# Patient Record
Sex: Female | Born: 1985 | Race: Black or African American | Hispanic: No | Marital: Married | Smoking: Never smoker
Health system: Southern US, Community
[De-identification: ages and names within clinical notes are randomized; demographics above are authoritative.]

## PROBLEM LIST (undated history)

## (undated) ENCOUNTER — Inpatient Hospital Stay (HOSPITAL_COMMUNITY): Payer: Self-pay

## (undated) DIAGNOSIS — F329 Major depressive disorder, single episode, unspecified: Secondary | ICD-10-CM

## (undated) DIAGNOSIS — G5602 Carpal tunnel syndrome, left upper limb: Secondary | ICD-10-CM

## (undated) DIAGNOSIS — F32A Depression, unspecified: Secondary | ICD-10-CM

## (undated) DIAGNOSIS — G709 Myoneural disorder, unspecified: Secondary | ICD-10-CM

## (undated) HISTORY — PX: NO PAST SURGERIES: SHX2092

---

## 2009-09-26 ENCOUNTER — Emergency Department (HOSPITAL_BASED_OUTPATIENT_CLINIC_OR_DEPARTMENT_OTHER): Admission: EM | Admit: 2009-09-26 | Discharge: 2009-09-26 | Payer: Self-pay | Admitting: Emergency Medicine

## 2009-09-29 ENCOUNTER — Emergency Department (HOSPITAL_COMMUNITY): Admission: EM | Admit: 2009-09-29 | Discharge: 2009-09-29 | Payer: Self-pay | Admitting: Family Medicine

## 2010-01-12 ENCOUNTER — Emergency Department (HOSPITAL_BASED_OUTPATIENT_CLINIC_OR_DEPARTMENT_OTHER): Admission: EM | Admit: 2010-01-12 | Discharge: 2010-01-12 | Payer: Self-pay | Admitting: Emergency Medicine

## 2010-02-13 ENCOUNTER — Emergency Department (HOSPITAL_BASED_OUTPATIENT_CLINIC_OR_DEPARTMENT_OTHER): Admission: EM | Admit: 2010-02-13 | Discharge: 2010-02-13 | Payer: Self-pay | Admitting: Emergency Medicine

## 2010-11-14 ENCOUNTER — Emergency Department (INDEPENDENT_AMBULATORY_CARE_PROVIDER_SITE_OTHER): Payer: Self-pay

## 2010-11-14 ENCOUNTER — Emergency Department (HOSPITAL_BASED_OUTPATIENT_CLINIC_OR_DEPARTMENT_OTHER)
Admission: EM | Admit: 2010-11-14 | Discharge: 2010-11-14 | Disposition: A | Discharge status: Home / Self Care | Payer: Self-pay | Attending: Emergency Medicine | Admitting: Emergency Medicine

## 2010-11-14 DIAGNOSIS — G43909 Migraine, unspecified, not intractable, without status migrainosus: Secondary | ICD-10-CM | POA: Insufficient documentation

## 2010-11-14 DIAGNOSIS — R51 Headache: Secondary | ICD-10-CM

## 2010-12-14 LAB — URINALYSIS, ROUTINE W REFLEX MICROSCOPIC
Bilirubin Urine: NEGATIVE
Glucose, UA: NEGATIVE mg/dL
Hgb urine dipstick: NEGATIVE
Specific Gravity, Urine: 1.024 (ref 1.005–1.030)
Urobilinogen, UA: 1 mg/dL (ref 0.0–1.0)
pH: 6.5 (ref 5.0–8.0)

## 2010-12-28 LAB — RAPID STREP SCREEN (MED CTR MEBANE ONLY): Streptococcus, Group A Screen (Direct): NEGATIVE

## 2011-04-17 ENCOUNTER — Encounter: Payer: Self-pay | Admitting: *Deleted

## 2011-04-17 ENCOUNTER — Emergency Department (HOSPITAL_BASED_OUTPATIENT_CLINIC_OR_DEPARTMENT_OTHER)
Admission: EM | Admit: 2011-04-17 | Discharge: 2011-04-17 | Disposition: A | Discharge status: Home / Self Care | Payer: No Typology Code available for payment source | Attending: Emergency Medicine | Admitting: Emergency Medicine

## 2011-04-17 DIAGNOSIS — S335XXA Sprain of ligaments of lumbar spine, initial encounter: Secondary | ICD-10-CM | POA: Insufficient documentation

## 2011-04-17 DIAGNOSIS — S139XXA Sprain of joints and ligaments of unspecified parts of neck, initial encounter: Secondary | ICD-10-CM | POA: Insufficient documentation

## 2011-04-17 DIAGNOSIS — S39012A Strain of muscle, fascia and tendon of lower back, initial encounter: Secondary | ICD-10-CM

## 2011-04-17 DIAGNOSIS — S161XXA Strain of muscle, fascia and tendon at neck level, initial encounter: Secondary | ICD-10-CM

## 2011-04-17 MED ORDER — IBUPROFEN 800 MG PO TABS
800.0000 mg | ORAL_TABLET | Freq: Once | ORAL | Status: AC
  Administered 2011-04-17: 800 mg via ORAL
  Filled 2011-04-17: qty 1

## 2011-04-17 MED ORDER — IBUPROFEN 800 MG PO TABS: 800.0000 mg | ORAL_TABLET | Freq: Three times a day (TID) | ORAL | Status: AC

## 2011-04-17 MED ORDER — CYCLOBENZAPRINE HCL 10 MG PO TABS: 10.0000 mg | ORAL_TABLET | Freq: Two times a day (BID) | ORAL | Status: AC | PRN
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
## 2011-05-15 ENCOUNTER — Inpatient Hospital Stay (HOSPITAL_COMMUNITY)
Admission: AD | Admit: 2011-05-15 | Discharge: 2011-05-16 | Disposition: A | Discharge status: Home / Self Care | Payer: BC Managed Care – PPO | Source: Ambulatory Visit | Attending: Obstetrics and Gynecology | Admitting: Obstetrics and Gynecology

## 2011-05-15 DIAGNOSIS — O99891 Other specified diseases and conditions complicating pregnancy: Secondary | ICD-10-CM | POA: Insufficient documentation

## 2011-05-15 DIAGNOSIS — T148XXA Other injury of unspecified body region, initial encounter: Secondary | ICD-10-CM

## 2011-05-15 DIAGNOSIS — N909 Noninflammatory disorder of vulva and perineum, unspecified: Secondary | ICD-10-CM | POA: Insufficient documentation

## 2011-05-15 DIAGNOSIS — M545 Low back pain, unspecified: Secondary | ICD-10-CM | POA: Insufficient documentation

## 2011-05-15 DIAGNOSIS — N9089 Other specified noninflammatory disorders of vulva and perineum: Secondary | ICD-10-CM

## 2011-05-15 DIAGNOSIS — Z3490 Encounter for supervision of normal pregnancy, unspecified, unspecified trimester: Secondary | ICD-10-CM

## 2011-05-15 DIAGNOSIS — O09899 Supervision of other high risk pregnancies, unspecified trimester: Secondary | ICD-10-CM

## 2011-05-16 ENCOUNTER — Inpatient Hospital Stay (HOSPITAL_COMMUNITY): Payer: BC Managed Care – PPO

## 2011-05-16 ENCOUNTER — Encounter: Payer: Self-pay | Admitting: Advanced Practice Midwife

## 2011-05-16 ENCOUNTER — Encounter (HOSPITAL_COMMUNITY): Payer: Self-pay | Admitting: Obstetrics and Gynecology

## 2011-05-16 DIAGNOSIS — M549 Dorsalgia, unspecified: Secondary | ICD-10-CM | POA: Insufficient documentation

## 2011-05-16 DIAGNOSIS — Z3490 Encounter for supervision of normal pregnancy, unspecified, unspecified trimester: Secondary | ICD-10-CM | POA: Insufficient documentation

## 2011-05-16 LAB — URINALYSIS, ROUTINE W REFLEX MICROSCOPIC
Glucose, UA: NEGATIVE mg/dL
Ketones, ur: NEGATIVE mg/dL
Leukocytes, UA: NEGATIVE
Nitrite: NEGATIVE
Specific Gravity, Urine: 1.015 (ref 1.005–1.030)
pH: 7 (ref 5.0–8.0)

## 2011-05-16 LAB — ANTIBODY SCREEN: Antibody Screen: NEGATIVE

## 2011-05-16 LAB — POCT PREGNANCY, URINE: Preg Test, Ur: POSITIVE

## 2011-05-16 LAB — ABO/RH: ABO/RH(D): O POS

## 2011-05-16 LAB — URINE MICROSCOPIC-ADD ON

## 2011-05-16 LAB — WET PREP, GENITAL: Clue Cells Wet Prep HPF POC: NONE SEEN

## 2011-05-16 LAB — HCG, QUANTITATIVE, PREGNANCY: hCG, Beta Chain, Quant, S: 787 m[IU]/mL — ABNORMAL HIGH (ref <5)

## 2011-05-16 MED ORDER — ACETAMINOPHEN 500 MG PO TABS
1000.0000 mg | ORAL_TABLET | Freq: Once | ORAL | Status: AC
  Administered 2011-05-16: 1000 mg via ORAL
  Filled 2011-05-16: qty 2

## 2011-05-16 MED ORDER — NYSTATIN-TRIAMCINOLONE 100000-0.1 UNIT/GM-% EX CREA: TOPICAL_CREAM | Freq: Four times a day (QID) | CUTANEOUS | Status: DC

## 2011-05-16 MED ORDER — CONCEPT OB 130-92.4-1 MG PO CAPS: 1.0000 | ORAL_CAPSULE | Freq: Every day | ORAL | Status: DC

## 2011-05-16 NOTE — Progress Notes (Signed)
Pt states, " I started having yellow vaginal discharge on Tues and then vaginal itching last night.  IToday I have burning when I pee, and I feel like I have to go right back. I have pain in my low back, and when I was walking tonight I had pain in my mid to upper abdomen. I looked at my vagina and I have little cuts."

## 2011-05-16 NOTE — ED Provider Notes (Signed)
History   The pt is a 25 year-old female who presents to MAU reporting: 1. Frequency x 1 week 2. External burning w/ urination, vulvar itching, scant yellow, odorless discharge and "little cuts" on her vulva since yesterday  3. Constant sore LBP that wraps around to her abd since yesterday since performing heavy lifting. She denies hematuria, VB, flank pain, fever or chills. LMP 04/01/11. Started new sexual relationship 04/25/11. Denies other recent partners.  Chief Complaint  Patient presents with  . Urinary Frequency  . Vaginal Pain  . Abdominal Pain  . Back Pain   HPI  Pertinent Gynecological History: Menses: flow is moderate and regular every 28 days without intermenstrual spotting Bleeding: normal Contraception: none   Past Medical History  Diagnosis Date  . Migraine     Past Surgical History  Procedure Date  . No past surgeries     No family history on file.  History  Substance Use Topics  . Smoking status: Never Smoker   . Smokeless tobacco: Not on file  . Alcohol Use: No    Allergies:  Allergies  Allergen Reactions  . Cheese Other (See Comments)    Face swells  . Shrimp (Shellfish Allergy) Other (See Comments)    Face swells    Prescriptions prior to admission  Medication Sig Dispense Refill  . acetaminophen (TYLENOL) 325 MG tablet Take 650 mg by mouth every 6 (six) hours as needed. pain       . ibuprofen (ADVIL,MOTRIN) 200 MG tablet Take 200 mg by mouth every 6 (six) hours as needed. pain       . diphenhydramine-acetaminophen (TYLENOL PM) 25-500 MG TABS Take 1 tablet by mouth at bedtime as needed. sleep         ROS Physical Exam   Blood pressure 100/61, pulse 83, temperature 98.7 F (37.1 C), temperature source Oral, resp. rate 20, height 5' 5.75" (1.67 m), weight 54.091 kg (119 lb 4 oz), last menstrual period 04/01/2011.  Physical Exam  Constitutional: She is oriented to person, place, and time. She appears well-developed and well-nourished. No  distress.  HENT:  Head: Normocephalic.  Cardiovascular: Normal rate.   Respiratory: Effort normal.  GI: Soft. She exhibits no distension. There is no tenderness. There is no CVA tenderness.  Genitourinary: Vagina normal and uterus normal.    Uterus is not enlarged and not tender. Cervix exhibits discharge. Cervix exhibits no motion tenderness and no friability. Right adnexum displays no mass, no tenderness and no fullness. Left adnexum displays no mass, no tenderness and no fullness. No bleeding around the vagina. No vaginal discharge found.       Fissures and mild excoriation on perineum. No vesicles or erythema.  Musculoskeletal: Normal range of motion.  Neurological: She is alert and oriented to person, place, and time.  Skin: Skin is warm and dry.  Psychiatric: She has a normal mood and affect.   Results for orders placed during the hospital encounter of 05/15/11 (from the past 24 hour(s))  URINALYSIS, ROUTINE W REFLEX MICROSCOPIC     Status: Abnormal   Collection Time   05/16/11 12:20 AM      Component Value Range   Color, Urine YELLOW  YELLOW    Appearance CLEAR  CLEAR    Specific Gravity, Urine 1.015  1.005 - 1.030    pH 7.0  5.0 - 8.0    Glucose, UA NEGATIVE  NEGATIVE (mg/dL)   Hgb urine dipstick TRACE (*) NEGATIVE    Bilirubin Urine NEGATIVE  NEGATIVE    Ketones, ur NEGATIVE  NEGATIVE (mg/dL)   Protein, ur NEGATIVE  NEGATIVE (mg/dL)   Urobilinogen, UA 0.2  0.0 - 1.0 (mg/dL)   Nitrite NEGATIVE  NEGATIVE    Leukocytes, UA NEGATIVE  NEGATIVE   URINE MICROSCOPIC-ADD ON     Status: Normal   Collection Time   05/16/11 12:20 AM      Component Value Range   Squamous Epithelial / LPF RARE  RARE    WBC, UA 0-2  <3 (WBC/hpf)   RBC / HPF 0-2  <3 (RBC/hpf)  POCT PREGNANCY, URINE     Status: Normal   Collection Time   05/16/11  1:02 AM      Component Value Range   Preg Test, Ur POSITIVE    WET PREP, GENITAL     Status: Abnormal   Collection Time   05/16/11  1:40 AM       Component Value Range   Yeast, Wet Prep NONE SEEN  NONE SEEN    Trich, Wet Prep NONE SEEN  NONE SEEN    Clue Cells, Wet Prep NONE SEEN  NONE SEEN    WBC, Wet Prep HPF POC FEW (*) NONE SEEN    US Ob Comp Less 14 Wks  05/16/2011  *RADIOLOGY REPORT*  Clinical Data: Back pain, positive pregnancy test; no quantitative beta HCG for correlation  OBSTETRIC <14 WK [place] AND TRANSVAGINAL OB US  Technique:  Both transabdominal and transvaginal ultrasound examinations were performed for complete evaluation of the gestation as well as the maternal uterus, adnexal regions, and pelvic cul-de-sac.  Transvaginal technique was performed to assess early pregnancy.  Comparison:  None.  Intrauterine gestational sac:  Tiny focal fluid collection within endometrial canal, rounded, question tiny early intrauterine gestational sac. Yolk sac: Not identified Embryo: Not identified Cardiac Activity: N/A Heart Rate: N/A bpm  MSD: 2.3  mm     4 w    5 d [place] EDC: 01/18/2012  Maternal uterus/adnexae: No subchorionic hemorrhage. Right ovary normal size morphology, 1.8 x 1.9 x 3.9 cm. Left ovary measures 4.7 x 2.7 x 3.2 cm and contains a small corpus luteal cyst. No adnexal masses. Trace free pelvic fluid.  IMPRESSION: Probable tiny early intrauterine gestational sac, corresponding to gestational age of [redacted] weeks 5 days. No yolk sac or fetal pole are identified. Can establish fetal viability by follow-up ultrasound in 14 days.  Original Report Authenticated By: Lollie Marrow, M.D.    MAU Course  Procedures   Assessment and Plan  Assessment: 1. Early pregnancy. 6.3 weeks by [facility], 5.0 weeks by [facility]. Too early to determine if IUP by [place]. 2. LBP most likely MS, but cannot R/O ectopic 3. Perineal fissures  Plan: 1. Quant hcg 2. F/U US in 2 weeks 3. Ectopic precautions 4. Rx Triamcinolone 5. Urine culture  Orlandria Kissner 05/16/2011, 2:04 AM

## 2011-05-17 LAB — GC/CHLAMYDIA PROBE AMP, GENITAL: Chlamydia, DNA Probe: NEGATIVE

## 2011-05-17 NOTE — ED Provider Notes (Signed)
Agree with above note.  Raven Rhodes 05/17/2011 4:00 PM

## 2011-05-29 ENCOUNTER — Ambulatory Visit (HOSPITAL_COMMUNITY)
Admission: RE | Admit: 2011-05-29 | Discharge: 2011-05-29 | Disposition: A | Discharge status: Home / Self Care | Payer: BC Managed Care – PPO | Source: Ambulatory Visit | Attending: Obstetrics & Gynecology | Admitting: Obstetrics & Gynecology

## 2011-05-29 ENCOUNTER — Emergency Department (HOSPITAL_BASED_OUTPATIENT_CLINIC_OR_DEPARTMENT_OTHER)
Admission: EM | Admit: 2011-05-29 | Discharge: 2011-05-29 | Disposition: A | Discharge status: Home / Self Care | Payer: BC Managed Care – PPO | Attending: Emergency Medicine | Admitting: Emergency Medicine

## 2011-05-29 ENCOUNTER — Inpatient Hospital Stay (HOSPITAL_COMMUNITY)
Admission: AD | Admit: 2011-05-29 | Discharge: 2011-05-30 | Disposition: A | Discharge status: Home / Self Care | Payer: BC Managed Care – PPO | Source: Ambulatory Visit | Attending: Obstetrics & Gynecology | Admitting: Obstetrics & Gynecology

## 2011-05-29 ENCOUNTER — Encounter (HOSPITAL_BASED_OUTPATIENT_CLINIC_OR_DEPARTMENT_OTHER): Payer: Self-pay

## 2011-05-29 DIAGNOSIS — G43909 Migraine, unspecified, not intractable, without status migrainosus: Secondary | ICD-10-CM | POA: Insufficient documentation

## 2011-05-29 DIAGNOSIS — Z3490 Encounter for supervision of normal pregnancy, unspecified, unspecified trimester: Secondary | ICD-10-CM

## 2011-05-29 DIAGNOSIS — H9209 Otalgia, unspecified ear: Secondary | ICD-10-CM | POA: Insufficient documentation

## 2011-05-29 DIAGNOSIS — O208 Other hemorrhage in early pregnancy: Secondary | ICD-10-CM | POA: Insufficient documentation

## 2011-05-29 DIAGNOSIS — M26629 Arthralgia of temporomandibular joint, unspecified side: Secondary | ICD-10-CM

## 2011-05-29 DIAGNOSIS — O99891 Other specified diseases and conditions complicating pregnancy: Secondary | ICD-10-CM | POA: Insufficient documentation

## 2011-05-29 DIAGNOSIS — M2669 Other specified disorders of temporomandibular joint: Secondary | ICD-10-CM | POA: Insufficient documentation

## 2011-05-29 DIAGNOSIS — M549 Dorsalgia, unspecified: Secondary | ICD-10-CM

## 2011-05-29 DIAGNOSIS — O3680X0 Pregnancy with inconclusive fetal viability, not applicable or unspecified: Secondary | ICD-10-CM

## 2011-05-29 MED ORDER — HYDROCODONE-ACETAMINOPHEN 5-325 MG PO TABS
1.0000 | ORAL_TABLET | Freq: Once | ORAL | Status: AC
  Administered 2011-05-29: 1 via ORAL
  Filled 2011-05-29: qty 1

## 2011-05-29 MED ORDER — HYDROCODONE-ACETAMINOPHEN 5-325 MG PO TABS: 1.0000 | ORAL_TABLET | Freq: Four times a day (QID) | ORAL | Status: AC | PRN

## 2011-05-29 NOTE — ED Notes (Signed)
Pt states that she has a severe earache on the R side.  Pt states that pain is radiating to her R jaw/teeth.  Pt states that she has not taken any medications for this pain.  Pt denies hx of same.  No other symptoms noted.

## 2011-05-29 NOTE — Progress Notes (Signed)
Pt states, "I was told to return today for an [place]. I am not bleeding, but occasionally have low abd and back pain, but none today."

## 2011-05-29 NOTE — ED Provider Notes (Addendum)
History     CSN: [id number] Arrival date & time: 05/29/2011  5:29 AM  Chief Complaint  Patient presents with  . Otalgia   HPI This is a 25 year old black female with a history of pain in the right ear began yesterday evening about 6 PM. The pain is principally in the right temporomandibular joint radiating to the right jaw. It is exacerbated by palpation and movement of the jaw. Movement of the jaw is limited due to pain. There is no associated fever. There is no associated otorrhea. She has no history of the same.  Past Medical History  Diagnosis Date  . Migraine     Past Surgical History  Procedure Date  . No past surgeries     History reviewed. No pertinent family history.  History  Substance Use Topics  . Smoking status: Never Smoker   . Smokeless tobacco: Not on file  . Alcohol Use: No    OB History    Grav Para Term Preterm Abortions TAB SAB Ect Mult Living   1 0 0 0 0 0 0 0 0 0       Review of Systems  All other systems reviewed and are negative.    Physical Exam  BP 111/62  Pulse 90  Temp(Src) 98.6 F (37 C) (Oral)  Resp 17  Ht 5\' 6"  (1.676 m)  Wt 120 lb (54.432 kg)  BMI 19.37 kg/m2  SpO2 100%  LMP 04/01/2011  Physical Exam General: Well-developed, well-nourished female in no acute distress; appearance consistent with age of record HENT: normocephalic, atraumatic; tympanic membranes pearly gray with light reflex bilaterally; no otorrhea; tenderness of right temporomandibular joint and pain on and right temporomandibular joint on movement of jaw; limited range of motion of right jaw due to right temporomandibular joint pain; no erythema, edema or warmth associated with right external ear; no right mastoid tenderness Eyes: pupils equal round and reactive to light; extraocular muscles intact Neck: supple Heart: regular rate and rhythm Lungs: Normal respiratory effort and excursion Abdomen: Soft, nondistended Extremities: No deformity; full range of  motion Neurologic: Awake, alert and oriented;motor function intact in all extremities and symmetric Skin: Warm and dry    ED Course  Procedures  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 We'll treat for pain and muscle spasm refer to dentistry.      Hanley Seamen, MD 05/29/11 [id number]  Hanley Seamen, MD 05/29/11 [phone]

## 2011-05-30 ENCOUNTER — Inpatient Hospital Stay (HOSPITAL_COMMUNITY): Payer: BC Managed Care – PPO

## 2011-05-30 NOTE — ED Notes (Signed)
Raven Rhodes RNP in triage room with pt to discus follow labs and [place]. Pt instructed to make appointment as planned with Dr. Gaynell Face

## 2011-05-30 NOTE — ED Provider Notes (Signed)
History     Chief Complaint  Patient presents with  . Follow-up   HPI Raven Rhodes 25 y.o. comes to MAU tonight for follow up from visit 05-16-11.  Had ultrasound then which showed IUGS with no yolk sac and no fetal pole.    OB History    Grav Para Term Preterm Abortions TAB SAB Ect Mult Living   1 0 0 0 0 0 0 0 0 0       Past Medical History  Diagnosis Date  . Migraine     Past Surgical History  Procedure Date  . No past surgeries     No family history on file.  History  Substance Use Topics  . Smoking status: Never Smoker   . Smokeless tobacco: Not on file  . Alcohol Use: No    Allergies:  Allergies  Allergen Reactions  . Cheese Other (See Comments)    Face swells  . Shrimp (Shellfish Allergy) Other (See Comments)    Face swells    Prescriptions prior to admission  Medication Sig Dispense Refill  . acetaminophen (TYLENOL) 325 MG tablet Take 650 mg by mouth every 6 (six) hours as needed. pain       . HYDROcodone-acetaminophen (NORCO) 5-325 MG per tablet Take 1-2 tablets by mouth every 6 (six) hours as needed for pain.  20 tablet  0  . nystatin-triamcinolone (MYCOLOG II) cream Apply topically 4 (four) times daily. Use for shortest duration needed. Do not use longer than two weeks.  30 g  0  . Prenat w/o A Vit-FeFum-FePo-FA (CONCEPT OB) 130-92.4-1 MG CAPS Take 1 tablet by mouth daily.  30 capsule  12    Review of Systems  Gastrointestinal: Negative for abdominal pain.  Genitourinary:       No vaginal discharge. No vaginal bleeding. No dysuria.     Physical Exam   Blood pressure 109/59, pulse 101, temperature 99.1 F (37.3 C), temperature source Oral, resp. rate 18, height 5\' 6"  (1.676 m), weight 121 lb 6 oz (55.055 kg), last menstrual period 04/01/2011.  Physical Exam  Nursing note and vitals reviewed. Constitutional: She is oriented to person, place, and time. She appears well-developed and well-nourished.  HENT:  Head: Normocephalic.  Eyes: EOM  are normal.  Neck: Neck supple.  Musculoskeletal: Normal range of motion.  Neurological: She is alert and oriented to person, place, and time.  Skin: Skin is warm and dry.  Psychiatric: She has a normal mood and affect.    MAU Course  Procedures *RADIOLOGY REPORT*  Clinical Data: Fall viability. Pelvic pain and. This may  gestational age by last menstrual period equals 8 weeks 3 days  TRANSVAGINAL OB ULTRASOUND  Technique: Transvaginal ultrasound was performed for evaluation of  the gestation as well as the maternal uterus and adnexal regions.  Comparison:  Findings: Single intrauterine the five gestational sac with yolk  sac and embryo. There is cardiac activity with heart rate equal to  115 beats per minute.  Crown-rump length equals 6.1 mm for estimated gestational age of [redacted]  weeks 3 days.  There is a moderate subchorionic hemorrhage. The ovaries are  normal. No free fluid.  IMPRESSION:  1. Single uterine gestational sac with embryo and normal cardiac  activity.  2. Estimated gestational age by crown-rump length = 6 weeks 3 days. EDC 01-20-12 3. Moderate to large subchorionic hemorrhage.      Assessment and Plan  Viable pregnancy with large subchorionic bleed  Plan:   No smoking, no  drugs, no alcohol.   Take a prenatal vitamin one by mouth every day.   Eat small frequent snacks to avoid nausea.   Begin prenatal care as soon as possible.  BURLESON,TERRI 05/30/2011, 1:44 AM   Nolene Bernheim, NP 05/30/11 0211  Nolene Bernheim, NP 05/30/11 [phone]

## 2011-05-31 NOTE — ED Provider Notes (Signed)
Attestation of Attending Supervision of Advanced Practitioner: Evaluation and management procedures were performed by the PA/NP/CNM/OB Fellow under my supervision/collaboration. Chart reviewed and agree with management and plan.  Hattie Aguinaldo A 05/31/2011 3:23 PM   

## 2011-07-13 ENCOUNTER — Inpatient Hospital Stay (HOSPITAL_COMMUNITY)
Admission: AD | Admit: 2011-07-13 | Discharge: 2011-07-13 | Disposition: A | Discharge status: Home / Self Care | Payer: Medicaid Other | Source: Ambulatory Visit | Attending: Family Medicine | Admitting: Family Medicine

## 2011-07-13 ENCOUNTER — Encounter (HOSPITAL_COMMUNITY): Payer: Self-pay | Admitting: *Deleted

## 2011-07-13 DIAGNOSIS — B9689 Other specified bacterial agents as the cause of diseases classified elsewhere: Secondary | ICD-10-CM

## 2011-07-13 DIAGNOSIS — N760 Acute vaginitis: Secondary | ICD-10-CM

## 2011-07-13 DIAGNOSIS — O2390 Unspecified genitourinary tract infection in pregnancy, unspecified trimester: Secondary | ICD-10-CM | POA: Insufficient documentation

## 2011-07-13 DIAGNOSIS — R109 Unspecified abdominal pain: Secondary | ICD-10-CM | POA: Insufficient documentation

## 2011-07-13 DIAGNOSIS — B373 Candidiasis of vulva and vagina: Secondary | ICD-10-CM

## 2011-07-13 DIAGNOSIS — B3731 Acute candidiasis of vulva and vagina: Secondary | ICD-10-CM | POA: Insufficient documentation

## 2011-07-13 DIAGNOSIS — A499 Bacterial infection, unspecified: Secondary | ICD-10-CM

## 2011-07-13 DIAGNOSIS — N949 Unspecified condition associated with female genital organs and menstrual cycle: Secondary | ICD-10-CM

## 2011-07-13 LAB — URINALYSIS, ROUTINE W REFLEX MICROSCOPIC
Bilirubin Urine: NEGATIVE
Hgb urine dipstick: NEGATIVE
Ketones, ur: NEGATIVE mg/dL
Specific Gravity, Urine: <1.005 — ABNORMAL LOW (ref 1.005–1.030)
pH: 6 (ref 5.0–8.0)

## 2011-07-13 LAB — WET PREP, GENITAL: Trich, Wet Prep: NONE SEEN

## 2011-07-13 LAB — URINE MICROSCOPIC-ADD ON

## 2011-07-13 MED ORDER — METRONIDAZOLE 500 MG PO TABS: 500.0000 mg | ORAL_TABLET | Freq: Two times a day (BID) | ORAL | Status: AC

## 2011-07-13 MED ORDER — IBUPROFEN 600 MG PO TABS: 600.0000 mg | ORAL_TABLET | Freq: Four times a day (QID) | ORAL | Status: AC | PRN

## 2011-07-13 MED ORDER — TERCONAZOLE 0.4 % VA CREA: 1.0000 | TOPICAL_CREAM | Freq: Every day | VAGINAL | Status: AC

## 2011-07-13 NOTE — ED Provider Notes (Signed)
History   The patient is a 25 y.o. year old G12P0000 female at [redacted]w[redacted]d weeks gestation who presents to MAU reporting bilat low abd pain since straining w/ a BM yesterday. The pain extends up the right side of her abd. She denies constipation, VB, LOF, change in appetite, fever, chiils, N/V/D, UTI Sx.   CSN: [id number] Arrival date & time: 07/13/2011  2:38 AM  Chief Complaint  Patient presents with  . Abdominal Pain    (Consider location/radiation/quality/duration/timing/severity/associated sxs/prior treatment) HPI  Past Medical History  Diagnosis Date  . Migraine     Past Surgical History  Procedure Date  . No past surgeries     No family history on file.  History  Substance Use Topics  . Smoking status: Never Smoker   . Smokeless tobacco: Not on file  . Alcohol Use: No    OB History    Grav Para Term Preterm Abortions TAB SAB Ect Mult Living   1 0 0 0 0 0 0 0 0 0       Review of Systems  All other systems reviewed and are negative.    Allergies  Cheese and Shrimp  Home Medications  No current outpatient prescriptions on file.  BP 114/62  Pulse 83  Temp(Src) 98.2 F (36.8 C) (Oral)  Resp 20  Ht 5\' 6"  (1.676 m)  Wt 58.06 kg (128 lb)  BMI 20.66 kg/m2  LMP 04/01/2011  Physical Exam  Constitutional: She is oriented to person, place, and time. She appears well-developed and well-nourished. No distress.  Cardiovascular: Normal rate.   Pulmonary/Chest: Effort normal.  Abdominal: Soft. Bowel sounds are normal. She exhibits no distension and no mass. There is no tenderness. There is no rebound and no guarding.  Genitourinary: Uterus is enlarged. Uterus is not tender. Cervix exhibits no motion tenderness. There is erythema around the vagina. No tenderness or bleeding around the vagina. Vaginal discharge (mod amount of thick, white, mildly malodorous discharge) found.  Musculoskeletal: Normal range of motion.  Neurological: She is alert and oriented to person,  place, and time.  Skin: Skin is warm and dry.  Psychiatric: She has a normal mood and affect.    Cervix long and closed   ED Course  Procedures (including critical care time)  Results for orders placed during the hospital encounter of 07/13/11 (from the past 24 hour(s))  URINALYSIS, ROUTINE W REFLEX MICROSCOPIC     Status: Abnormal   Collection Time   07/13/11  2:50 AM      Component Value Range   Color, Urine YELLOW  YELLOW    Appearance CLEAR  CLEAR    Specific Gravity, Urine <1.005 (*) 1.005 - 1.030    pH 6.0  5.0 - 8.0    Glucose, UA NEGATIVE  NEGATIVE (mg/dL)   Hgb urine dipstick NEGATIVE  NEGATIVE    Bilirubin Urine NEGATIVE  NEGATIVE    Ketones, ur NEGATIVE  NEGATIVE (mg/dL)   Protein, ur NEGATIVE  NEGATIVE (mg/dL)   Urobilinogen, UA 0.2  0.0 - 1.0 (mg/dL)   Nitrite NEGATIVE  NEGATIVE    Leukocytes, UA TRACE (*) NEGATIVE   URINE MICROSCOPIC-ADD ON     Status: Abnormal   Collection Time   07/13/11  2:50 AM      Component Value Range   Squamous Epithelial / LPF FEW (*) RARE    WBC, UA 3-6  <3 (WBC/hpf)   RBC / HPF 0-2  <3 (RBC/hpf)  WET PREP, GENITAL     Status: Abnormal  Collection Time   07/13/11  4:20 AM      Component Value Range   Yeast, Wet Prep FEW (*) NONE SEEN    Trich, Wet Prep NONE SEEN  NONE SEEN    Clue Cells, Wet Prep MODERATE (*) NONE SEEN    WBC, Wet Prep HPF POC TOO NUMEROUS TO COUNT (*) NONE SEEN     MDM  Assessment: 1. BV 2. Vaginal Yeast infection 3. Round ligament pains 4. 12.5 week IUP  Plan: 1. D/C home 2. Rx Flagyl and Terazol 7 3. Comfort measures  Raven Rhodes 07/13/2011 4:50 AM

## 2011-07-13 NOTE — ED Provider Notes (Signed)
Chart reviewed and agree with management and plan.  

## 2011-08-17 LAB — HIV ANTIBODY (ROUTINE TESTING W REFLEX): HIV: NONREACTIVE

## 2011-08-17 LAB — RPR: RPR: NONREACTIVE

## 2011-09-06 ENCOUNTER — Inpatient Hospital Stay (HOSPITAL_COMMUNITY)
Admission: AD | Admit: 2011-09-06 | Discharge: 2011-09-06 | Disposition: A | Discharge status: Home / Self Care | Payer: Medicaid Other | Source: Ambulatory Visit | Attending: Obstetrics | Admitting: Obstetrics

## 2011-09-06 ENCOUNTER — Encounter (HOSPITAL_COMMUNITY): Payer: Self-pay | Admitting: *Deleted

## 2011-09-06 DIAGNOSIS — R109 Unspecified abdominal pain: Secondary | ICD-10-CM | POA: Insufficient documentation

## 2011-09-06 DIAGNOSIS — M5431 Sciatica, right side: Secondary | ICD-10-CM

## 2011-09-06 DIAGNOSIS — R1011 Right upper quadrant pain: Secondary | ICD-10-CM

## 2011-09-06 DIAGNOSIS — M25539 Pain in unspecified wrist: Secondary | ICD-10-CM

## 2011-09-06 DIAGNOSIS — O99891 Other specified diseases and conditions complicating pregnancy: Secondary | ICD-10-CM | POA: Insufficient documentation

## 2011-09-06 DIAGNOSIS — N949 Unspecified condition associated with female genital organs and menstrual cycle: Secondary | ICD-10-CM

## 2011-09-06 HISTORY — DX: Major depressive disorder, single episode, unspecified: F32.9

## 2011-09-06 HISTORY — DX: Depression, unspecified: F32.A

## 2011-09-06 LAB — COMPREHENSIVE METABOLIC PANEL
Albumin: 3.2 g/dL — ABNORMAL LOW (ref 3.5–5.2)
BUN: 10 mg/dL (ref 6–23)
CO2: 23 mEq/L (ref 19–32)
Calcium: 9.1 mg/dL (ref 8.4–10.5)
Chloride: 102 mEq/L (ref 96–112)
Creatinine, Ser: 0.47 mg/dL — ABNORMAL LOW (ref 0.50–1.10)
GFR calc non Af Amer: >90 mL/min (ref >90)
Total Bilirubin: 0.2 mg/dL — ABNORMAL LOW (ref 0.3–1.2)

## 2011-09-06 LAB — URINALYSIS, ROUTINE W REFLEX MICROSCOPIC
Bilirubin Urine: NEGATIVE
Glucose, UA: NEGATIVE mg/dL
Hgb urine dipstick: NEGATIVE
Ketones, ur: NEGATIVE mg/dL
Leukocytes, UA: NEGATIVE
pH: 6 (ref 5.0–8.0)

## 2011-09-06 LAB — CBC
HCT: 32.2 % — ABNORMAL LOW (ref 36.0–46.0)
Hemoglobin: 10.6 g/dL — ABNORMAL LOW (ref 12.0–15.0)
MCHC: 32.9 g/dL (ref 30.0–36.0)
MCV: 88 fL (ref 78.0–100.0)

## 2011-09-06 LAB — DIFFERENTIAL
Basophils Relative: 0 % (ref 0–1)
Eosinophils Relative: 2 % (ref 0–5)
Monocytes Absolute: 1.2 10*3/uL — ABNORMAL HIGH (ref 0.1–1.0)
Monocytes Relative: 10 % (ref 3–12)
Neutro Abs: 9.3 10*3/uL — ABNORMAL HIGH (ref 1.7–7.7)

## 2011-09-06 LAB — AMYLASE: Amylase: 101 U/L (ref 0–105)

## 2011-09-06 LAB — LIPASE, BLOOD: Lipase: 28 U/L (ref 11–59)

## 2011-09-06 MED ORDER — OXYCODONE-ACETAMINOPHEN 5-325 MG PO TABS
2.0000 | ORAL_TABLET | Freq: Once | ORAL | Status: DC
  Filled 2011-09-06: qty 2

## 2011-09-06 MED ORDER — OXYCODONE-ACETAMINOPHEN 5-325 MG PO TABS: 2.0000 | ORAL_TABLET | Freq: Once | ORAL | Status: AC

## 2011-09-06 MED ORDER — GI COCKTAIL ~~LOC~~
30.0000 mL | Freq: Once | ORAL | Status: AC
  Administered 2011-09-06: 30 mL via ORAL
  Filled 2011-09-06: qty 30

## 2011-09-06 NOTE — ED Provider Notes (Signed)
History     Chief Complaint  Patient presents with  . Abdominal Pain   HPI This is a 25 y.o. at [redacted]w[redacted]d who presents with multiple complaints. They have been present for a week. States she called the office and they told her to come here. She c/o Right lower intermittent sharp pain that has been there off and on but worse this week. Not associated with dysuria or contractions. She was seen for this at 12 weeks and they felt it was round ligament pain.   She also c/o right hip/sciatic pain which shoots down leg at times. Mostly occurs when walking. Occasionally at night. No weakness in foot or toes. No change in gait.  Also c/o intermittent left wrist pain which is burning in nature and radiates down top of hand. Occurs at night, whenever she flexes wrist. Does not do any repetitive motion during the day.  No weakness in hand or fingers.   Also c/o occasional times at night where she wakes up feeling like she cannot breathe. She cries out and sits up and then feels better. States it happens once or twice a month. No history of asthma or sleep apnea.    OB History    Grav Para Term Preterm Abortions TAB SAB Ect Mult Living   1 0 0 0 0 0 0 0 0 0       Past Medical History  Diagnosis Date  . Migraine   . Depression     Past Surgical History  Procedure Date  . No past surgeries     History reviewed. No pertinent family history.  History  Substance Use Topics  . Smoking status: Never Smoker   . Smokeless tobacco: Not on file  . Alcohol Use: No    Allergies:  Allergies  Allergen Reactions  . Cheese Other (See Comments)    Face swells  . Shrimp (Shellfish Allergy) Other (See Comments)    Face swells     (Not in a hospital admission)  ROS See above  Physical Exam   Blood pressure 121/58, pulse 82, temperature 99.2 F (37.3 C), temperature source Oral, resp. rate 18, last menstrual period 04/01/2011.  Physical Exam  Constitutional: She appears well-developed and  well-nourished. No distress.  HENT:  Head: Normocephalic.  Cardiovascular: Normal rate, regular rhythm and normal heart sounds.   Respiratory: Effort normal and breath sounds normal. No respiratory distress. She has no wheezes.  GI: Soft. She exhibits no distension and no mass. There is no tenderness. There is no rebound and no guarding.  Genitourinary: Vagina normal and uterus normal. No vaginal discharge found.       Cervix long and closed  Musculoskeletal: Normal range of motion. She exhibits no edema and no tenderness.       Straight leg raise is negative bilaterally Normal foot flexion and extension and strength  Neurological: She is alert. She displays normal reflexes.  Skin: Skin is warm and dry.  Psychiatric: She has a normal mood and affect.   Results for orders placed during the hospital encounter of 09/06/11 (from the past 24 hour(s))  URINALYSIS, ROUTINE W REFLEX MICROSCOPIC     Status: Abnormal   Collection Time   09/06/11  8:28 PM      Component Value Range   Color, Urine YELLOW  YELLOW    APPearance CLEAR  CLEAR    Specific Gravity, Urine >1.030 (*) 1.005 - 1.030    pH 6.0  5.0 - 8.0    Glucose,  UA NEGATIVE  NEGATIVE (mg/dL)   Hgb urine dipstick NEGATIVE  NEGATIVE    Bilirubin Urine NEGATIVE  NEGATIVE    Ketones, ur NEGATIVE  NEGATIVE (mg/dL)   Protein, ur NEGATIVE  NEGATIVE (mg/dL)   Urobilinogen, UA 0.2  0.0 - 1.0 (mg/dL)   Nitrite NEGATIVE  NEGATIVE    Leukocytes, UA NEGATIVE  NEGATIVE     MAU Course  Procedures   Assessment and Plan  A:  IUP at [redacted]w[redacted]d      Probably round ligament pain      Probable sciatica      Probably carpal tunnel P:  Reassurance      Will talk to Dr Gaynell Face about ortho eval      Suggested wrist splint  [facility] 09/06/2011, 9:35 PM   2200 hrs:  While preparing to discharge patient, she began to have sudden onset of right middle and lower quadrant pain.  Very tender to touch, but no rebound.  States has these episodes  (different than the round lig. Pain) intermittently over the past several years. Not exacerbated by eating. No nausea with them. Used to have constipation, no longer does. States BMs normal now.  WIll check CBC and CMET  Results for orders placed during the hospital encounter of 09/06/11 (from the past 24 hour(s))  URINALYSIS, ROUTINE W REFLEX MICROSCOPIC     Status: Abnormal   Collection Time   09/06/11  8:28 PM      Component Value Range   Color, Urine YELLOW  YELLOW    APPearance CLEAR  CLEAR    Specific Gravity, Urine >1.030 (*) 1.005 - 1.030    pH 6.0  5.0 - 8.0    Glucose, UA NEGATIVE  NEGATIVE (mg/dL)   Hgb urine dipstick NEGATIVE  NEGATIVE    Bilirubin Urine NEGATIVE  NEGATIVE    Ketones, ur NEGATIVE  NEGATIVE (mg/dL)   Protein, ur NEGATIVE  NEGATIVE (mg/dL)   Urobilinogen, UA 0.2  0.0 - 1.0 (mg/dL)   Nitrite NEGATIVE  NEGATIVE    Leukocytes, UA NEGATIVE  NEGATIVE   CBC     Status: Abnormal   Collection Time   09/06/11 10:00 PM      Component Value Range   WBC 12.8 (*) 4.0 - 10.5 (K/uL)   RBC 3.66 (*) 3.87 - 5.11 (MIL/uL)   Hemoglobin 10.6 (*) 12.0 - 15.0 (g/dL)   HCT 40.9 (*) 81.1 - 46.0 (%)   MCV 88.0  78.0 - 100.0 (fL)   MCH 29.0  26.0 - 34.0 (pg)   MCHC 32.9  30.0 - 36.0 (g/dL)   RDW 91.4  78.2 - 95.6 (%)   Platelets 207  150 - 400 (K/uL)  DIFFERENTIAL     Status: Abnormal   Collection Time   09/06/11 10:00 PM      Component Value Range   Neutrophils Relative 73  43 - 77 (%)   Neutro Abs 9.3 (*) 1.7 - 7.7 (K/uL)   Lymphocytes Relative 15  12 - 46 (%)   Lymphs Abs 2.0  0.7 - 4.0 (K/uL)   Monocytes Relative 10  3 - 12 (%)   Monocytes Absolute 1.2 (*) 0.1 - 1.0 (K/uL)   Eosinophils Relative 2  0 - 5 (%)   Eosinophils Absolute 0.3  0.0 - 0.7 (K/uL)   Basophils Relative 0  0 - 1 (%)   Basophils Absolute 0.0  0.0 - 0.1 (K/uL)  COMPREHENSIVE METABOLIC PANEL     Status: Abnormal   Collection Time  09/06/11 10:00 PM      Component Value Range   Sodium 134 (*)  135 - 145 (mEq/L)   Potassium 3.8  3.5 - 5.1 (mEq/L)   Chloride 102  96 - 112 (mEq/L)   CO2 23  19 - 32 (mEq/L)   Glucose, Bld 86  70 - 99 (mg/dL)   BUN 10  6 - 23 (mg/dL)   Creatinine, Ser 1.61 (*) 0.50 - 1.10 (mg/dL)   Calcium 9.1  8.4 - 09.6 (mg/dL)   Total Protein 6.7  6.0 - 8.3 (g/dL)   Albumin 3.2 (*) 3.5 - 5.2 (g/dL)   AST 70 (*) 0 - 37 (U/L)   ALT 123 (*) 0 - 35 (U/L)   Alkaline Phosphatase 74  39 - 117 (U/L)   Total Bilirubin 0.2 (*) 0.3 - 1.2 (mg/dL)   GFR calc non Af Amer >90  >90 (mL/min)   GFR calc Af Amer >90  >90 (mL/min)   Discussed with Dr Tamela Oddi. She recommends giving a GI cocktail and having Dr Gaynell Face follow up with outpatient gallbladder ultrasound.  Patient ate before she came in tonight.   2333 hrs  States GI cocktail did not help. Will give analgesics to help with pain until she can get a gallbladder evaluation via Dr Gaynell Face.

## 2011-09-06 NOTE — Progress Notes (Signed)
Pt c/o of R side pelvic pain that occurs about 3-4 times a day for the past week; also c/o having random shooting pain down her R leg for past week;

## 2011-09-28 NOTE — L&D Delivery Note (Signed)
Delivery Note At  a viable unspecified sex was delivered via  (Presentation: ;  ).  APGAR: , ; weight .   Placenta status: , .  Cord:  with the following complications: .  Cord pH: not done  Anesthesia:   Episiotomy:  Lacerations:  Suture Repair: 2.0 vicryl Est. Blood Loss (mL):   Mom to postpartum.  Baby to nursery-stable.  Raven Rhodes A 01/12/2012, 9:16 PM

## 2011-11-10 DIAGNOSIS — O26879 Cervical shortening, unspecified trimester: Secondary | ICD-10-CM | POA: Insufficient documentation

## 2011-11-10 DIAGNOSIS — R109 Unspecified abdominal pain: Secondary | ICD-10-CM | POA: Insufficient documentation

## 2011-11-11 ENCOUNTER — Inpatient Hospital Stay (HOSPITAL_COMMUNITY): Payer: Medicaid Other

## 2011-11-11 ENCOUNTER — Inpatient Hospital Stay (HOSPITAL_COMMUNITY)
Admission: AD | Admit: 2011-11-11 | Discharge: 2011-11-11 | Disposition: A | Discharge status: Home / Self Care | Payer: Medicaid Other | Source: Ambulatory Visit | Attending: Obstetrics | Admitting: Obstetrics

## 2011-11-11 ENCOUNTER — Encounter (HOSPITAL_COMMUNITY): Payer: Self-pay | Admitting: *Deleted

## 2011-11-11 DIAGNOSIS — O26879 Cervical shortening, unspecified trimester: Secondary | ICD-10-CM

## 2011-11-11 DIAGNOSIS — R109 Unspecified abdominal pain: Secondary | ICD-10-CM

## 2011-11-11 LAB — URINALYSIS, ROUTINE W REFLEX MICROSCOPIC
Bilirubin Urine: NEGATIVE
Ketones, ur: NEGATIVE mg/dL
Nitrite: NEGATIVE
Urobilinogen, UA: 0.2 mg/dL (ref 0.0–1.0)

## 2011-11-11 NOTE — Progress Notes (Signed)
Pt returned from ultrasound via wheelchair accompanied by ultrasound tech.pt returned to monitor and Raven Rhodes CNM made aware.

## 2011-11-11 NOTE — Progress Notes (Signed)
Pt returned from ultrasound and placed on EFm . N. Bascom Levels CNM made aware.

## 2011-11-11 NOTE — Discharge Instructions (Signed)
You should remain on bed rest until you are reevaluated by your doctor. You should spend most of your day resting, lying down. You can be up for showers, using the bathroom and meals. No sexual activity, nothing in the vagina. Make sure you are drinking plenty of water. Report worsening pain, vaginal bleeding, or contractions to your doctor.

## 2011-11-11 NOTE — Progress Notes (Signed)
Pt reports lower abd pain esp on left side all day, worsening at about 2300, now feels constant and is worse with fetal movement. Denies bleeding

## 2011-11-11 NOTE — ED Provider Notes (Signed)
History     Chief Complaint  Patient presents with  . Abdominal Pain   HPI 26 y.o. G1P0000 at [redacted]w[redacted]d c/o low abd pain, greater on left side, intermittent x 3 days, states that it used to be on the right side, worse with ambulation and fetal movement. No bleeding or LOF.     Past Medical History  Diagnosis Date  . Migraine   . Depression     Past Surgical History  Procedure Date  . No past surgeries     Family History  Problem Relation Age of Onset  . Hypertension Mother     History  Substance Use Topics  . Smoking status: Never Smoker   . Smokeless tobacco: Not on file  . Alcohol Use: No    Allergies:  Allergies  Allergen Reactions  . Cheese Other (See Comments)    Face swells  . Shrimp (Shellfish Allergy) Other (See Comments)    Face swells    Prescriptions prior to admission  Medication Sig Dispense Refill  . Calcium Carbonate Antacid (TUMS PO) Take 1 tablet by mouth daily.      Burnis Medin w/o A Vit-FeFum-FePo-FA (CONCEPT OB) 130-92.4-1 MG CAPS Take 1 tablet by mouth daily.  30 capsule  12    Review of Systems  Constitutional: Negative.   Respiratory: Negative.   Cardiovascular: Negative.   Gastrointestinal: Positive for abdominal pain. Negative for nausea, vomiting, diarrhea and constipation.  Genitourinary: Negative for dysuria, urgency, frequency, hematuria and flank pain.       Negative for vaginal bleeding, cramping/contractions  Musculoskeletal: Negative.   Neurological: Negative.   Psychiatric/Behavioral: Negative.    Physical Exam   Blood pressure 113/72, pulse 114, temperature 98.2 F (36.8 C), resp. rate 18, last menstrual period 04/01/2011.  Physical Exam  Nursing note and vitals reviewed. Constitutional: She is oriented to person, place, and time. She appears well-developed and well-nourished. No distress.  HENT:  Head: Normocephalic and atraumatic.  Cardiovascular: Normal rate.   Respiratory: Effort normal.  GI: Soft. Bowel sounds  are normal. She exhibits no mass. There is no tenderness. There is no rebound and no guarding.  Genitourinary: There is no rash or lesion on the right labia. There is no rash or lesion on the left labia. Uterus is not tender. Enlarged: Size c/w dates. Cervix exhibits no motion tenderness, no discharge and no friability. Right adnexum displays no mass, no tenderness and no fullness. Left adnexum displays no mass, no tenderness and no fullness. No tenderness or bleeding around the vagina. Vaginal discharge (thick white) found.       ZOX:WRUE/AVWUJ/WJXBJY/NWGN   Musculoskeletal: Normal range of motion.  Neurological: She is alert and oriented to person, place, and time.  Skin: Skin is warm and dry.  Psychiatric: She has a normal mood and affect.   EFM: 130, mod variability, + 10x10 accels, no decels TOCO: quiet  MAU Course  Procedures  Results for orders placed during the hospital encounter of 11/11/11 (from the past 48 hour(s))  URINALYSIS, ROUTINE W REFLEX MICROSCOPIC     Status: Abnormal   Collection Time   11/11/11 12:05 AM      Component Value Range Comment   Color, Urine YELLOW  YELLOW     APPearance CLEAR  CLEAR     Specific Gravity, Urine <1.005 (*) 1.005 - 1.030     pH 6.0  5.0 - 8.0     Glucose, UA NEGATIVE  NEGATIVE (mg/dL)    Hgb urine dipstick NEGATIVE  NEGATIVE     Bilirubin Urine NEGATIVE  NEGATIVE     Ketones, ur NEGATIVE  NEGATIVE (mg/dL)    Protein, ur NEGATIVE  NEGATIVE (mg/dL)    Urobilinogen, UA 0.2  0.0 - 1.0 (mg/dL)    Nitrite NEGATIVE  NEGATIVE     Leukocytes, UA NEGATIVE  NEGATIVE  MICROSCOPIC NOT DONE ON URINES WITH NEGATIVE PROTEIN, BLOOD, LEUKOCYTES, NITRITE, OR GLUCOSE <1000 mg/dL.  WET PREP, GENITAL     Status: Abnormal   Collection Time   11/11/11  1:07 AM      Component Value Range Comment   Yeast Wet Prep HPF POC NONE SEEN  NONE SEEN     Trich, Wet Prep NONE SEEN  NONE SEEN     Clue Cells Wet Prep HPF POC NONE SEEN  NONE SEEN     WBC, Wet Prep HPF  POC FEW (*) NONE SEEN  MODERATE BACTERIA SEEN   U/S: Placenta anterior, no abruption; AFI 15.22, Cervical length 1.2-1.9 cm with funneling of internal os measuring 6 mm x 6.6 mm  Assessment and Plan  26 y.o. G1P0000 at [redacted]w[redacted]d Shortened cervix - recommended bedrest, rev'd PTL warning signs  Low abd pain - likely round ligament pain, no contractions on TOCO, no contractions palpated F/U next week as scheduled or sooner PRN  Otis Portal 11/11/2011, 3:13 AM

## 2011-11-12 LAB — GC/CHLAMYDIA PROBE AMP, GENITAL: Chlamydia, DNA Probe: NEGATIVE

## 2011-12-12 ENCOUNTER — Inpatient Hospital Stay (HOSPITAL_COMMUNITY): Payer: Medicaid Other

## 2011-12-12 ENCOUNTER — Encounter (HOSPITAL_COMMUNITY): Payer: Self-pay | Admitting: *Deleted

## 2011-12-12 ENCOUNTER — Inpatient Hospital Stay (HOSPITAL_COMMUNITY)
Admission: AD | Admit: 2011-12-12 | Discharge: 2011-12-12 | Disposition: A | Discharge status: Home / Self Care | Payer: Medicaid Other | Source: Ambulatory Visit | Attending: Obstetrics | Admitting: Obstetrics

## 2011-12-12 DIAGNOSIS — R109 Unspecified abdominal pain: Secondary | ICD-10-CM | POA: Insufficient documentation

## 2011-12-12 DIAGNOSIS — O4700 False labor before 37 completed weeks of gestation, unspecified trimester: Secondary | ICD-10-CM | POA: Insufficient documentation

## 2011-12-12 DIAGNOSIS — O99891 Other specified diseases and conditions complicating pregnancy: Secondary | ICD-10-CM | POA: Insufficient documentation

## 2011-12-12 DIAGNOSIS — O26899 Other specified pregnancy related conditions, unspecified trimester: Secondary | ICD-10-CM

## 2011-12-12 LAB — URINALYSIS, ROUTINE W REFLEX MICROSCOPIC
Bilirubin Urine: NEGATIVE
Glucose, UA: NEGATIVE mg/dL
Hgb urine dipstick: NEGATIVE
Ketones, ur: NEGATIVE mg/dL
Protein, ur: NEGATIVE mg/dL

## 2011-12-12 MED ORDER — ACETAMINOPHEN 325 MG PO TABS: 650.0000 mg | ORAL_TABLET | Freq: Once | ORAL | Status: DC

## 2011-12-12 MED ORDER — ACETAMINOPHEN-CODEINE #3 300-30 MG PO TABS: 1.0000 | ORAL_TABLET | Freq: Four times a day (QID) | ORAL | Status: AC | PRN

## 2011-12-12 NOTE — MAU Provider Note (Signed)
History     CSN: [id number]  Arrival date and time: 12/12/11 1412   First Provider Initiated Contact with Patient 12/12/11 1450      Chief Complaint  Patient presents with  . Labor Eval   HPI Pt is [redacted]w[redacted]d G1Po who presents with abdominal pain every 5 minutes since 5 am.  Pt denies vaginal discharge, leakage of fluid or vaginal bleeding.  Pt also denies UTI symptoms, constipation or diarrhea. Review of chart notes that pt presented 1 month ago with same symptoms.  Ultrasound showed funneling of cervix and shortened cervix but otherwise normal.    Past Medical History  Diagnosis Date  . Migraine   . Depression     Past Surgical History  Procedure Date  . No past surgeries     Family History  Problem Relation Age of Onset  . Hypertension Mother     History  Substance Use Topics  . Smoking status: Never Smoker   . Smokeless tobacco: Not on file  . Alcohol Use: No    Allergies:  Allergies  Allergen Reactions  . Cheese Other (See Comments)    Face swells  . Shrimp (Shellfish Allergy) Other (See Comments)    Face swells    Prescriptions prior to admission  Medication Sig Dispense Refill  . Prenatal Vit-Fe Fumarate-FA (PRENATAL MULTIVITAMIN) TABS Take 1 tablet by mouth daily.        Review of Systems  Constitutional: Negative for fever and chills.  Respiratory: Negative for cough.   Gastrointestinal: Positive for abdominal pain. Negative for nausea, vomiting, diarrhea and constipation.  Genitourinary: Negative for dysuria, urgency and frequency.  Neurological: Negative for headaches.   Physical Exam   Blood pressure 110/62, pulse 96, temperature 97.5 F (36.4 C), temperature source Oral, resp. rate 18, height 5\' 6"  (1.676 m), weight 165 lb (74.844 kg), last menstrual period 04/01/2011.  Physical Exam  Vitals reviewed. Constitutional: She is oriented to person, place, and time. She appears well-developed and well-nourished.  HENT:  Head: Normocephalic.    Eyes: Pupils are equal, round, and reactive to light.  Neck: Normal range of motion. Neck supple.  Cardiovascular: Normal rate.   Respiratory: Effort normal.  GI: Soft. She exhibits no distension and no mass. There is tenderness. There is guarding. There is no rebound.       Pt has localized tenderness to right of umbilicus with palpation- no rebound.  Pt is in tears when palpated abdomen.Pt radiates to vagina and also to back.  FHR reactive baseline 148 bpm; ctx every 10 minutes initially then 1 every ~30 minutes.  By the time pt got to her exam room, contractions had dimished.    Genitourinary:       Cervix fingertip, posterior, high station  Musculoskeletal: Normal range of motion.  Neurological: She is alert and oriented to person, place, and time.  Skin: Skin is warm and dry.  Psychiatric: She has a normal mood and affect.    MAU Course  Procedures Because of pt's intense localized pain, an ultrasound was performed that showed normal placenta anterior above cervical os without abruption or previa identified. Cephalic presentation.  AFI subjectively within normal limits.  Cervix 1.6 cm Abdominal pain in pregnancy ?round ligament pain Threatened pre term labor- arrested Pt contractions/pain ceased after pt hydrated and ate and pt returned from ultrasound- pt declined recheck of cervix and willing and able to be discharged home . Results for orders placed during the hospital encounter of 12/12/11 (from the past  24 hour(s))  URINALYSIS, ROUTINE W REFLEX MICROSCOPIC     Status: Abnormal   Collection Time   12/12/11  3:28 PM      Component Value Range   Color, Urine YELLOW  YELLOW    APPearance CLEAR  CLEAR    Specific Gravity, Urine <1.005 (*) 1.005 - 1.030    pH 6.5  5.0 - 8.0    Glucose, UA NEGATIVE  NEGATIVE (mg/dL)   Hgb urine dipstick NEGATIVE  NEGATIVE    Bilirubin Urine NEGATIVE  NEGATIVE    Ketones, ur NEGATIVE  NEGATIVE (mg/dL)   Protein, ur NEGATIVE  NEGATIVE (mg/dL)    Urobilinogen, UA 0.2  0.0 - 1.0 (mg/dL)   Nitrite NEGATIVE  NEGATIVE    Leukocytes, UA NEGATIVE  NEGATIVE    Assessment and Plan  Abdominal pain in pregnancy Threatened preterm labor Tylenol #3 #10 no RF given for abdominal pain if needed F/u with Dr. Gaynell Face on Thursday for OB appointment- sooner if recurrence or increase in pain, LOF, or bleeding.    Cena Bruhn 12/12/2011, 3:06 PM

## 2011-12-12 NOTE — Discharge Instructions (Signed)
Abdominal Pain During Pregnancy  Abdominal discomfort is common in pregnancy. Most of the time, it does not cause harm. There are many causes of abdominal pain. Some causes are more serious than others. Some of the causes of abdominal pain in pregnancy are easily diagnosed. Occasionally, the diagnosis takes time to understand. Other times, the cause is not determined. Abdominal pain can be a sign that something is very wrong with the pregnancy, or the pain may have nothing to do with the pregnancy at all. For this reason, always tell your caregiver if you have any abdominal discomfort.  CAUSES  Common and harmless causes of abdominal pain include:   Constipation.   Excess gas and bloating.   Round ligament pain. This is pain that is felt in the folds of the groin.   The position the baby or placenta is in.   Baby kicks.   Braxton-Hicks contractions. These are mild contractions that do not cause cervical dilation.  Serious causes of abdominal pain include:   Ectopic pregnancy. This happens when a fertilized egg implants outside of the uterus.   Miscarriage.   Preterm labor. This is when labor starts at less than 37 weeks of pregnancy.   Placental abruption. This is when the placenta partially or completely separates from the uterus.   Preeclampsia. This is often associated with high blood pressure and has been referred to as "toxemia in pregnancy."   Uterine or amniotic fluid infections.  Causes unrelated to pregnancy include:   Urinary tract infection.   Gallbladder stones or inflammation.   Hepatitis or other liver illness.   Intestinal problems, stomach flu, food poisoning, or ulcer.   Appendicitis.   Kidney (renal) stones.   Kidney infection (pylonephritis).  HOME CARE INSTRUCTIONS   For mild pain:   Do not have sexual intercourse or put anything in your vagina until your symptoms go away completely.   Get plenty of rest until your pain improves. If your pain does not improve in 1 hour, call  your caregiver.   Drink clear fluids if you feel nauseous. Avoid solid food as long as you are uncomfortable or nauseous.   Only take medicine as directed by your caregiver.   Keep all follow-up appointments with your caregiver.  SEEK IMMEDIATE MEDICAL CARE IF:   You are bleeding, leaking fluid, or passing tissue from the vagina.   You have increasing pain or cramping.   You have persistent vomiting.   You have painful or bloody urination.   You have a fever.   You notice a decrease in your baby's movements.   You have extreme weakness or feel faint.   You have shortness of breath, with or without abdominal pain.   You develop a severe headache with abdominal pain.   You have abnormal vaginal discharge with abdominal pain.   You have persistent diarrhea.   You have abdominal pain that continues even after rest, or gets worse.  MAKE SURE YOU:    Understand these instructions.   Will watch your condition.   Will get help right away if you are not doing well or get worse.  Document Released: 09/13/2005 Document Revised: 09/02/2011 Document Reviewed: 04/09/2011  [employer] Patient Information 2012 [employer].

## 2012-01-06 ENCOUNTER — Encounter (HOSPITAL_COMMUNITY): Payer: Self-pay | Admitting: *Deleted

## 2012-01-06 ENCOUNTER — Telehealth (HOSPITAL_COMMUNITY): Payer: Self-pay | Admitting: *Deleted

## 2012-01-06 NOTE — Telephone Encounter (Signed)
Preadmission screen Translator number [phone]

## 2012-01-07 ENCOUNTER — Telehealth (HOSPITAL_COMMUNITY): Payer: Self-pay | Admitting: *Deleted

## 2012-01-07 NOTE — Telephone Encounter (Signed)
Preadmission screen  

## 2012-01-12 ENCOUNTER — Inpatient Hospital Stay (HOSPITAL_COMMUNITY): Payer: Medicaid Other | Admitting: Anesthesiology

## 2012-01-12 ENCOUNTER — Encounter (HOSPITAL_COMMUNITY): Payer: Self-pay | Admitting: *Deleted

## 2012-01-12 ENCOUNTER — Inpatient Hospital Stay (HOSPITAL_COMMUNITY)
Admission: AD | Admit: 2012-01-12 | Discharge: 2012-01-14 | DRG: 775 | Disposition: A | Discharge status: Home / Self Care | Payer: Medicaid Other | Source: Ambulatory Visit | Attending: Obstetrics | Admitting: Obstetrics

## 2012-01-12 ENCOUNTER — Encounter (HOSPITAL_COMMUNITY): Payer: Self-pay | Admitting: Anesthesiology

## 2012-01-12 DIAGNOSIS — O99892 Other specified diseases and conditions complicating childbirth: Principal | ICD-10-CM | POA: Diagnosis present

## 2012-01-12 DIAGNOSIS — M549 Dorsalgia, unspecified: Secondary | ICD-10-CM

## 2012-01-12 DIAGNOSIS — Z22330 Carrier of Group B streptococcus: Secondary | ICD-10-CM

## 2012-01-12 DIAGNOSIS — Z3490 Encounter for supervision of normal pregnancy, unspecified, unspecified trimester: Secondary | ICD-10-CM

## 2012-01-12 HISTORY — DX: Carpal tunnel syndrome, left upper limb: G56.02

## 2012-01-12 HISTORY — DX: Myoneural disorder, unspecified: G70.9

## 2012-01-12 LAB — CBC
HCT: 34.1 % — ABNORMAL LOW (ref 36.0–46.0)
MCH: 26.4 pg (ref 26.0–34.0)
MCHC: 31.7 g/dL (ref 30.0–36.0)
MCV: 83.4 fL (ref 78.0–100.0)
Platelets: 233 10*3/uL (ref 150–400)
RDW: 15.2 % (ref 11.5–15.5)

## 2012-01-12 MED ORDER — FLEET ENEMA 7-19 GM/118ML RE ENEM: 1.0000 | ENEMA | RECTAL | Status: DC | PRN

## 2012-01-12 MED ORDER — ACETAMINOPHEN 325 MG PO TABS: 650.0000 mg | ORAL_TABLET | ORAL | Status: DC | PRN

## 2012-01-12 MED ORDER — PHENYLEPHRINE 40 MCG/ML (10ML) SYRINGE FOR IV PUSH (FOR BLOOD PRESSURE SUPPORT)
80.0000 ug | PREFILLED_SYRINGE | INTRAVENOUS | Status: DC | PRN
  Filled 2012-01-12: qty 5

## 2012-01-12 MED ORDER — FENTANYL 2.5 MCG/ML BUPIVACAINE 1/10 % EPIDURAL INFUSION (WH - ANES)
14.0000 mL/h | INTRAMUSCULAR | Status: DC
  Administered 2012-01-12: 14 mL/h via EPIDURAL
  Filled 2012-01-12 (×2): qty 60

## 2012-01-12 MED ORDER — OXYTOCIN BOLUS FROM INFUSION
500.0000 mL | Freq: Once | INTRAVENOUS | Status: DC
  Filled 2012-01-12: qty 1000
  Filled 2012-01-12: qty 500

## 2012-01-12 MED ORDER — DIPHENHYDRAMINE HCL 50 MG/ML IJ SOLN: 12.5000 mg | INTRAMUSCULAR | Status: DC | PRN

## 2012-01-12 MED ORDER — OXYCODONE-ACETAMINOPHEN 5-325 MG PO TABS
1.0000 | ORAL_TABLET | ORAL | Status: DC | PRN
Start: 2012-01-12 — End: 2012-01-14
  Administered 2012-01-12 – 2012-01-13 (×2): 1 via ORAL
  Administered 2012-01-13: 2 via ORAL
  Administered 2012-01-14: 1 via ORAL
  Filled 2012-01-12: qty 2
  Filled 2012-01-12 (×3): qty 1

## 2012-01-12 MED ORDER — BUTORPHANOL TARTRATE 2 MG/ML IJ SOLN
1.0000 mg | INTRAMUSCULAR | Status: DC | PRN
  Administered 2012-01-12: 1 mg via INTRAVENOUS
  Filled 2012-01-12: qty 1

## 2012-01-12 MED ORDER — CITRIC ACID-SODIUM CITRATE 334-500 MG/5ML PO SOLN: 30.0000 mL | ORAL | Status: DC | PRN

## 2012-01-12 MED ORDER — LIDOCAINE HCL (PF) 1 % IJ SOLN
30.0000 mL | INTRAMUSCULAR | Status: DC | PRN
  Administered 2012-01-12: 30 mL via SUBCUTANEOUS
  Filled 2012-01-12: qty 30

## 2012-01-12 MED ORDER — LACTATED RINGERS IV SOLN
500.0000 mL | Freq: Once | INTRAVENOUS | Status: AC
  Administered 2012-01-12: 18:00:00 via INTRAVENOUS

## 2012-01-12 MED ORDER — PHENYLEPHRINE 40 MCG/ML (10ML) SYRINGE FOR IV PUSH (FOR BLOOD PRESSURE SUPPORT): 80.0000 ug | PREFILLED_SYRINGE | INTRAVENOUS | Status: DC | PRN

## 2012-01-12 MED ORDER — LACTATED RINGERS IV SOLN
INTRAVENOUS | Status: DC
  Administered 2012-01-12: 18:00:00 via INTRAVENOUS

## 2012-01-12 MED ORDER — BUPIVACAINE HCL (PF) 0.25 % IJ SOLN
INTRAMUSCULAR | Status: DC | PRN
  Administered 2012-01-12: 5 mL via EPIDURAL

## 2012-01-12 MED ORDER — LIDOCAINE HCL (PF) 1 % IJ SOLN
INTRAMUSCULAR | Status: DC | PRN
  Administered 2012-01-12 (×3): 4 mL

## 2012-01-12 MED ORDER — OXYTOCIN 20 UNITS IN LACTATED RINGERS INFUSION - SIMPLE
125.0000 mL/h | Freq: Once | INTRAVENOUS | Status: AC
  Administered 2012-01-12: 999 mL/h via INTRAVENOUS

## 2012-01-12 MED ORDER — IBUPROFEN 600 MG PO TABS
600.0000 mg | ORAL_TABLET | Freq: Four times a day (QID) | ORAL | Status: DC | PRN
  Administered 2012-01-12 – 2012-01-13 (×2): 600 mg via ORAL
  Filled 2012-01-12 (×4): qty 1

## 2012-01-12 MED ORDER — EPHEDRINE 5 MG/ML INJ
10.0000 mg | INTRAVENOUS | Status: DC | PRN
  Filled 2012-01-12: qty 4

## 2012-01-12 MED ORDER — ONDANSETRON HCL 4 MG/2ML IJ SOLN: 4.0000 mg | Freq: Four times a day (QID) | INTRAMUSCULAR | Status: DC | PRN

## 2012-01-12 MED ORDER — LACTATED RINGERS IV SOLN: 500.0000 mL | INTRAVENOUS | Status: DC | PRN

## 2012-01-12 MED ORDER — PENICILLIN G POTASSIUM 5000000 UNITS IJ SOLR
5.0000 10*6.[IU] | Freq: Once | INTRAVENOUS | Status: AC
  Administered 2012-01-12: 5 10*6.[IU] via INTRAVENOUS
  Filled 2012-01-12: qty 5

## 2012-01-12 MED ORDER — PENICILLIN G POTASSIUM 5000000 UNITS IJ SOLR
2.5000 10*6.[IU] | INTRAVENOUS | Status: DC
  Administered 2012-01-12 (×2): 2.5 10*6.[IU] via INTRAVENOUS
  Filled 2012-01-12 (×4): qty 2.5

## 2012-01-12 MED ORDER — OXYTOCIN 20 UNITS IN LACTATED RINGERS INFUSION - SIMPLE
1.0000 m[IU]/min | INTRAVENOUS | Status: DC
  Administered 2012-01-12: 1 m[IU]/min via INTRAVENOUS

## 2012-01-12 MED ORDER — EPHEDRINE 5 MG/ML INJ: 10.0000 mg | INTRAVENOUS | Status: DC | PRN

## 2012-01-12 MED ORDER — TERBUTALINE SULFATE 1 MG/ML IJ SOLN: 0.2500 mg | Freq: Once | INTRAMUSCULAR | Status: DC | PRN

## 2012-01-12 NOTE — H&P (Signed)
This is Dr. Francoise Ceo dictating the history and physical on  Raven Rhodes she's a 26 year old gravida 1 at 39+ weeks 30 EDC is 418 2 01/20/2012 and she was admitted in labor positive GBS for which she received penicillin cervix is now 4-5 cm 100% with the vertex at -1 station amniotomy was performed and the fluids clear estimated fetal weight 6 lbs. 6 oz. Past medical history was negative Past surgical history was negative System review noncontributory Social history negative Physical exam is a well-developed female in labor HEENT negative Breasts negative Heart regular rhythm no murmurs no gallops Lungs clear to P&A Abdomen term estimated fetal weight 6 lbs. 6 oz. Pelvic as described above Extremities negative

## 2012-01-12 NOTE — Progress Notes (Signed)
Offered to perform SVE--pt refuses

## 2012-01-12 NOTE — MAU Note (Signed)
SAYS HURT BAD   0045.  DENIES HSV AND MRSA.  VE IN OFFICE  2 CM- Thursday.

## 2012-01-12 NOTE — Anesthesia Preprocedure Evaluation (Signed)
Anesthesia Evaluation  Patient identified by MRN, date of birth, ID band Patient awake    Reviewed: Allergy & Precautions, H&P , NPO status , Patient's Chart, lab work & pertinent test results, reviewed documented beta blocker date and time   History of Anesthesia Complications Negative for: history of anesthetic complications  Airway Mallampati: II TM Distance: >3 FB Neck ROM: full    Dental  (+) Teeth Intact   Pulmonary neg pulmonary ROS,  breath sounds clear to auscultation        Cardiovascular negative cardio ROS  Rhythm:regular Rate:Normal     Neuro/Psych  Headaches,  Neuromuscular disease (carpal tunnel on left) negative psych ROS   GI/Hepatic negative GI ROS, Neg liver ROS,   Endo/Other  negative endocrine ROS  Renal/GU negative Renal ROS  negative genitourinary   Musculoskeletal   Abdominal   Peds  Hematology negative hematology ROS (+)   Anesthesia Other Findings   Reproductive/Obstetrics (+) Pregnancy                           Anesthesia Physical Anesthesia Plan  ASA: II  Anesthesia Plan: Epidural   Post-op Pain Management:    Induction:   Airway Management Planned:   Additional Equipment:   Intra-op Plan:   Post-operative Plan:   Informed Consent: I have reviewed the patients History and Physical, chart, labs and discussed the procedure including the risks, benefits and alternatives for the proposed anesthesia with the patient or authorized representative who has indicated his/her understanding and acceptance.     Plan Discussed with:   Anesthesia Plan Comments:         Anesthesia Quick Evaluation

## 2012-01-12 NOTE — Anesthesia Procedure Notes (Signed)
Epidural Patient location during procedure: OB Start time: 01/12/2012 5:53 PM Reason for block: procedure for pain  Staffing Performed by: anesthesiologist   Preanesthetic Checklist Completed: patient identified, site marked, surgical consent, pre-op evaluation, timeout performed, IV checked, risks and benefits discussed and monitors and equipment checked  Epidural Patient position: sitting Prep: site prepped and draped and DuraPrep Patient monitoring: continuous pulse ox and blood pressure Approach: midline Injection technique: LOR air  Needle:  Needle type: Tuohy  Needle gauge: 17 G Needle length: 9 cm Needle insertion depth: 5 cm cm Catheter type: closed end flexible Catheter size: 19 Gauge Catheter at skin depth: 10 cm Test dose: negative  Assessment Events: blood not aspirated, injection not painful, no injection resistance, negative IV test and no paresthesia  Additional Notes Discussed risk of headache, infection, bleeding, nerve injury and failed or incomplete block.  Patient voices understanding and wishes to proceed.

## 2012-01-12 NOTE — Progress Notes (Signed)
NSVD of viable female. 

## 2012-01-12 NOTE — MAU Note (Signed)
TO B-ROOM 

## 2012-01-13 LAB — CBC
HCT: 31.1 % — ABNORMAL LOW (ref 36.0–46.0)
Hemoglobin: 9.9 g/dL — ABNORMAL LOW (ref 12.0–15.0)
MCH: 26.6 pg (ref 26.0–34.0)
MCHC: 31.8 g/dL (ref 30.0–36.0)

## 2012-01-13 MED ORDER — DIPHENHYDRAMINE HCL 25 MG PO CAPS: 25.0000 mg | ORAL_CAPSULE | Freq: Four times a day (QID) | ORAL | Status: DC | PRN

## 2012-01-13 MED ORDER — BENZOCAINE-MENTHOL 20-0.5 % EX AERO
INHALATION_SPRAY | CUTANEOUS | Status: AC
  Filled 2012-01-13: qty 56

## 2012-01-13 MED ORDER — IBUPROFEN 600 MG PO TABS
600.0000 mg | ORAL_TABLET | Freq: Four times a day (QID) | ORAL | Status: DC
  Administered 2012-01-13 – 2012-01-14 (×5): 600 mg via ORAL
  Filled 2012-01-13 (×3): qty 1

## 2012-01-13 MED ORDER — FERROUS SULFATE 325 (65 FE) MG PO TABS
325.0000 mg | ORAL_TABLET | Freq: Two times a day (BID) | ORAL | Status: DC
  Administered 2012-01-13 – 2012-01-14 (×3): 325 mg via ORAL
  Filled 2012-01-13 (×3): qty 1

## 2012-01-13 MED ORDER — TETANUS-DIPHTH-ACELL PERTUSSIS 5-2.5-18.5 LF-MCG/0.5 IM SUSP
0.5000 mL | Freq: Once | INTRAMUSCULAR | Status: AC
  Administered 2012-01-14: 0.5 mL via INTRAMUSCULAR
  Filled 2012-01-13: qty 0.5

## 2012-01-13 MED ORDER — DIBUCAINE 1 % RE OINT: 1.0000 "application " | TOPICAL_OINTMENT | RECTAL | Status: DC | PRN

## 2012-01-13 MED ORDER — OXYCODONE-ACETAMINOPHEN 5-325 MG PO TABS: 1.0000 | ORAL_TABLET | ORAL | Status: DC | PRN

## 2012-01-13 MED ORDER — BENZOCAINE-MENTHOL 20-0.5 % EX AERO: 1.0000 "application " | INHALATION_SPRAY | CUTANEOUS | Status: DC | PRN

## 2012-01-13 MED ORDER — SENNOSIDES-DOCUSATE SODIUM 8.6-50 MG PO TABS
2.0000 | ORAL_TABLET | Freq: Every day | ORAL | Status: DC
  Administered 2012-01-13: 2 via ORAL

## 2012-01-13 MED ORDER — ONDANSETRON HCL 4 MG/2ML IJ SOLN: 4.0000 mg | INTRAMUSCULAR | Status: DC | PRN

## 2012-01-13 MED ORDER — ZOLPIDEM TARTRATE 5 MG PO TABS: 5.0000 mg | ORAL_TABLET | Freq: Every evening | ORAL | Status: DC | PRN

## 2012-01-13 MED ORDER — ONDANSETRON HCL 4 MG PO TABS: 4.0000 mg | ORAL_TABLET | ORAL | Status: DC | PRN

## 2012-01-13 MED ORDER — SIMETHICONE 80 MG PO CHEW: 80.0000 mg | CHEWABLE_TABLET | ORAL | Status: DC | PRN

## 2012-01-13 MED ORDER — WITCH HAZEL-GLYCERIN EX PADS: 1.0000 "application " | MEDICATED_PAD | CUTANEOUS | Status: DC | PRN

## 2012-01-13 MED ORDER — LANOLIN HYDROUS EX OINT: TOPICAL_OINTMENT | CUTANEOUS | Status: DC | PRN

## 2012-01-13 MED ORDER — PRENATAL MULTIVITAMIN CH
1.0000 | ORAL_TABLET | Freq: Every day | ORAL | Status: DC
  Administered 2012-01-13 – 2012-01-14 (×2): 1 via ORAL
  Filled 2012-01-13 (×2): qty 1

## 2012-01-13 NOTE — Progress Notes (Signed)
Patient ID: Raven Rhodes, female   DOB: 1986-07-11, 26 y.o.   MRN:  [id number] Postpartum day one Vital signs normal Fundus firm Lochia moderate Legs negative No complaints

## 2012-01-13 NOTE — Progress Notes (Signed)
UR Chart review completed.  

## 2012-01-13 NOTE — Anesthesia Postprocedure Evaluation (Signed)
  Anesthesia Post Note  Patient: Raven Rhodes  Procedure(s) Performed: * No procedures listed *  Anesthesia type: Epidural  Patient location: Mother/Baby  Post pain: Pain level controlled  Post assessment: Post-op Vital signs reviewed  Last Vitals:  Filed Vitals:   01/13/12 0423  BP: 109/67  Pulse: 72  Temp: 36.9 C  Resp: 20    Post vital signs: Reviewed  Level of consciousness:alert  Complications: No apparent anesthesia complications

## 2012-01-14 NOTE — Progress Notes (Signed)
Discharge instructions given, going to Health dept. For electric pump. Baby in NICU. Progress WDL. States understands instructions.

## 2012-01-14 NOTE — Discharge Instructions (Signed)
Discharge instructions   You can wash your hair  Shower  Eat what you want  Drink what you want  See me in 6 weeks  Your ankles are going to swell more in the next 2 weeks than when pregnant  No sex for 6 weeks   Yosgart Pavey A, MD 01/14/2012

## 2012-01-14 NOTE — Discharge Summary (Signed)
Obstetric Discharge Summary Reason for Admission: onset of labor Prenatal Procedures: none Intrapartum Procedures: spontaneous vaginal delivery Postpartum Procedures: none Complications-Operative and Postpartum: none Hemoglobin  Date Value Range Status  01/13/2012 9.9* 12.0-15.0 (g/dL) Final     HCT  Date Value Range Status  01/13/2012 31.1* 36.0-46.0 (%) Final    Physical Exam:  General: alert Lochia: appropriate Uterine Fundus: firm Incision: healing well DVT Evaluation: No evidence of DVT seen on physical exam.  Discharge Diagnoses: Term Pregnancy-delivered  Discharge Information: Date: 01/14/2012 Activity: pelvic rest Diet: routine Medications: Percocet Condition: stable Instructions: refer to practice specific booklet Discharge to: home Follow-up Information    Follow up with Takeia Ciaravino A, MD. Call in 6 weeks.   Contact information:   [address] [place] [address] [phone]          Newborn Data: Live born female  Birth Weight: 7 lb (3175 g) APGAR: 6, 7  Home with mother.  Reta Norgren A 01/14/2012, 7:20 AM

## 2012-01-19 ENCOUNTER — Inpatient Hospital Stay (HOSPITAL_COMMUNITY): Admission: RE | Admit: 2012-01-19 | Payer: Medicaid Other | Source: Ambulatory Visit

## 2012-06-21 IMAGING — US US OB TRANSVAGINAL
1 series · 14 of 28 positions shown · non-contrast
Comparison: None.

CLINICAL DATA: Back pain, positive pregnancy test; no quantitative
beta HCG for correlation

OBSTETRIC <14 WK US AND TRANSVAGINAL OB US
TECHNIQUE: Both transabdominal and transvaginal ultrasound
examinations were performed for complete evaluation of the
gestation as well as the maternal uterus, adnexal regions, and
pelvic cul-de-sac.  Transvaginal technique was performed to assess
early pregnancy.

[Series 1: us ob comp less 14 wks · 14 of 40 slices shown]
[im 2/40]
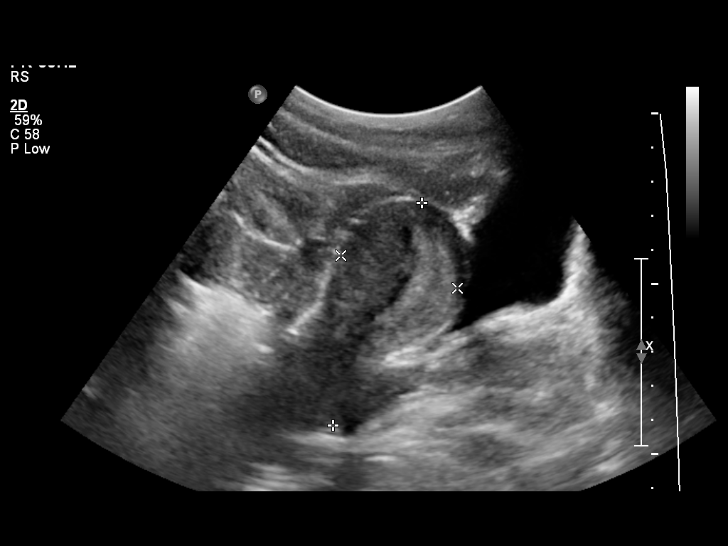
[im 5/40]
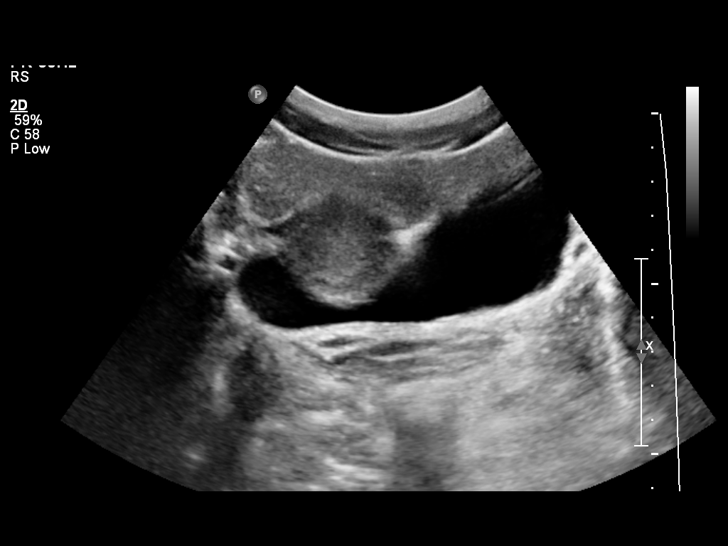
[im 8/40]
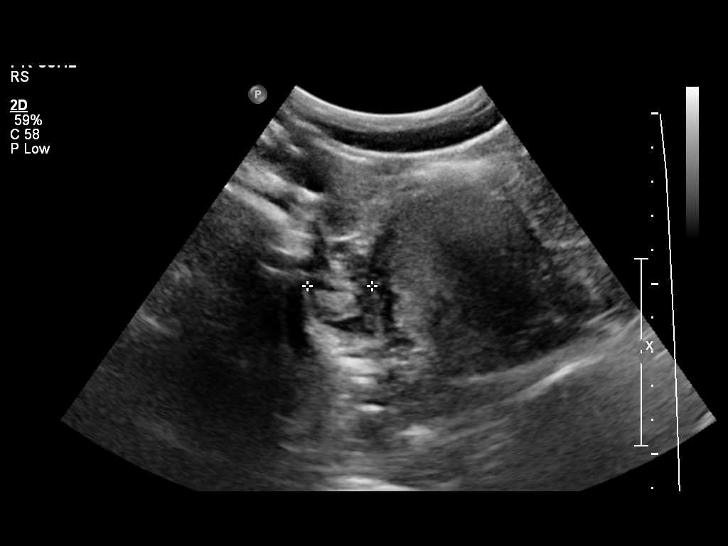
[im 11/40]
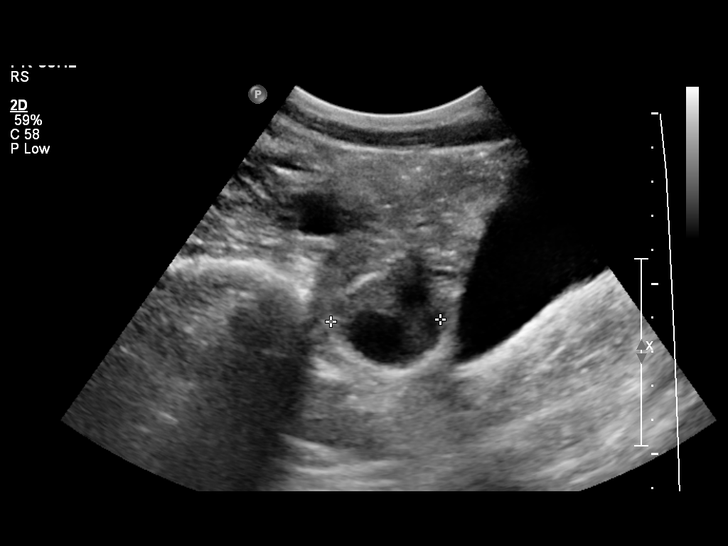
[im 14/40]
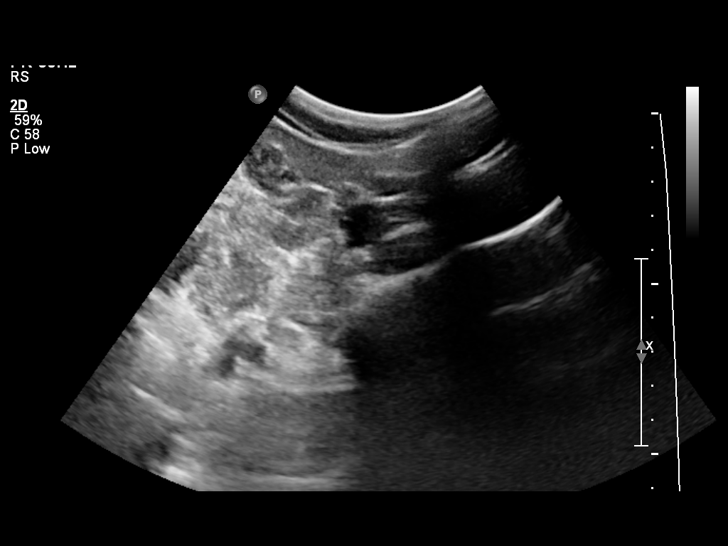
[im 16/40]
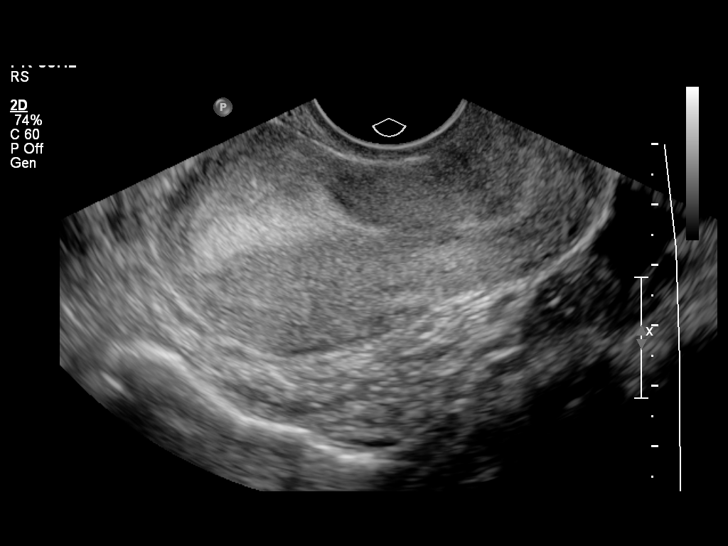
[im 19/40]
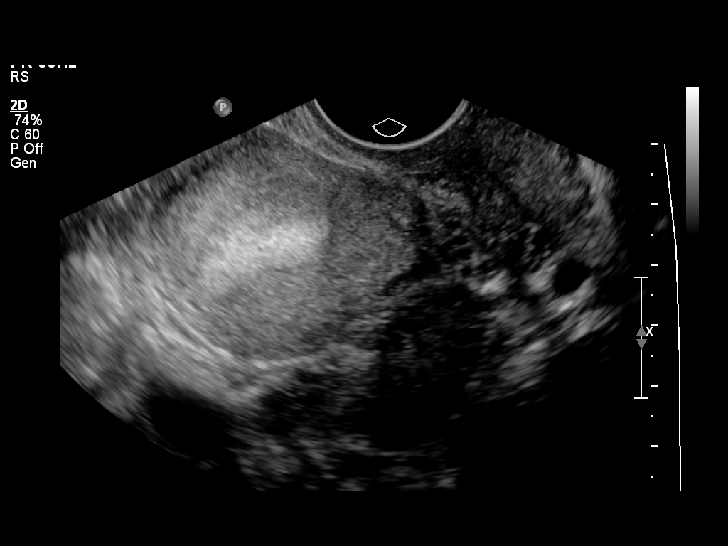
[im 22/40]
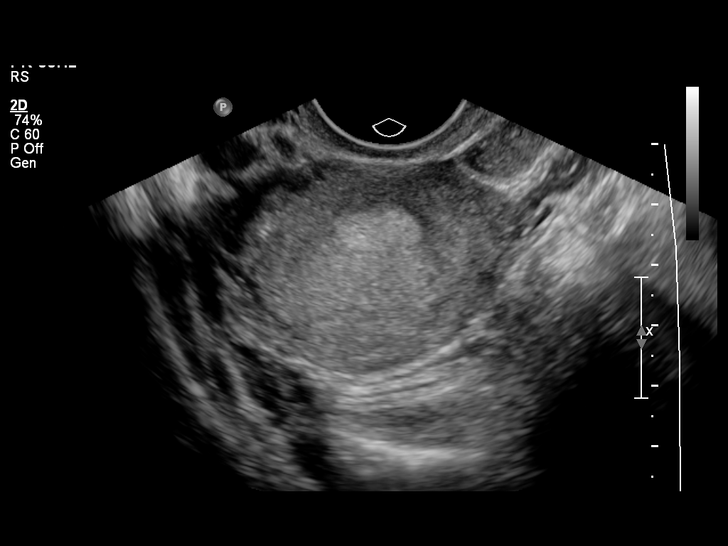
[im 25/40]
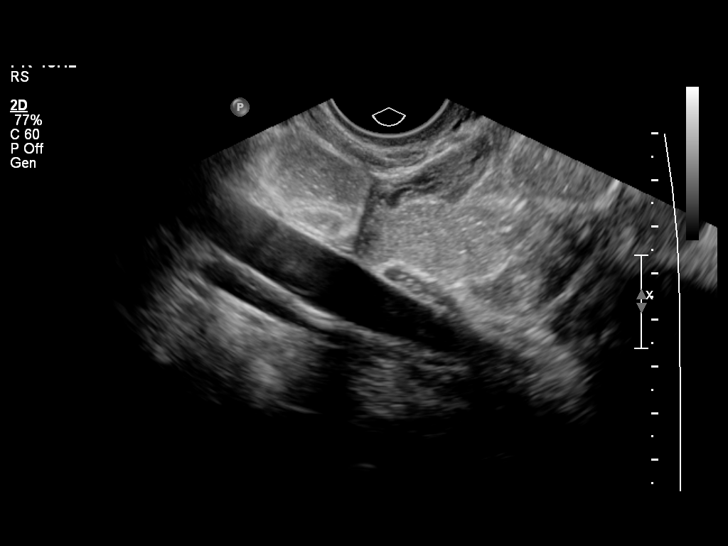
[im 28/40]
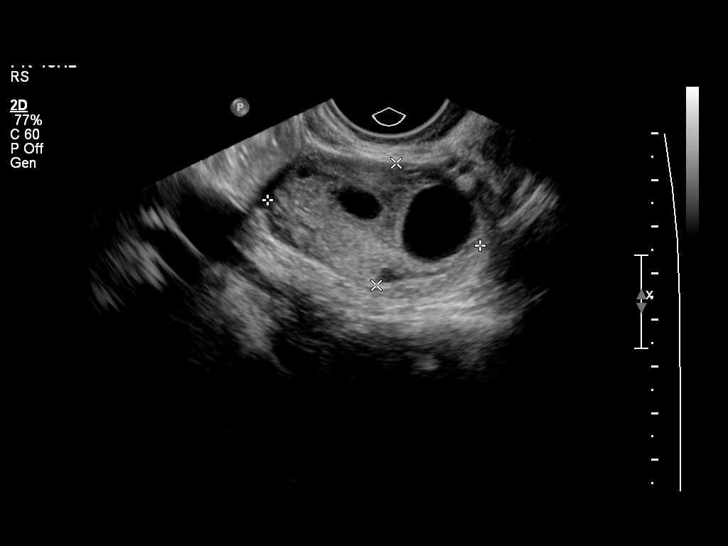
[im 31/40]
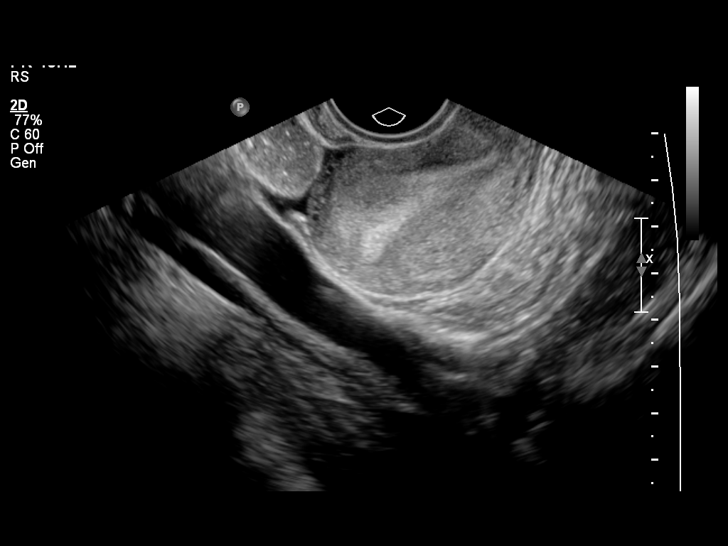
[im 34/40]
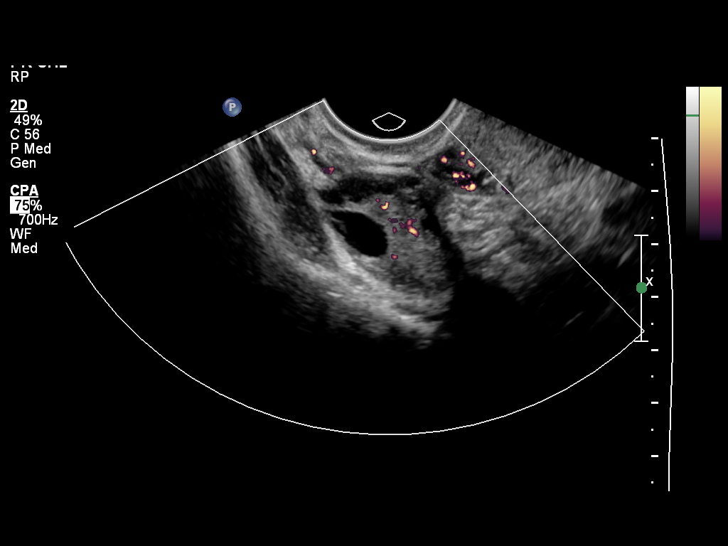
[im 37/40]
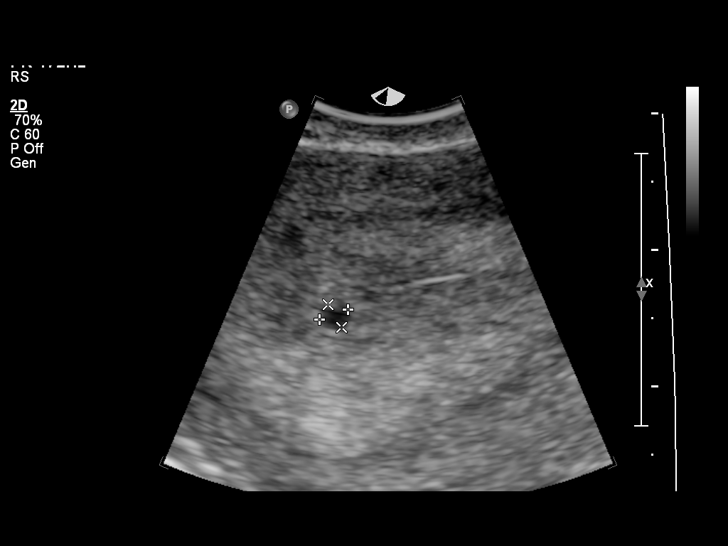
[im 40/40]
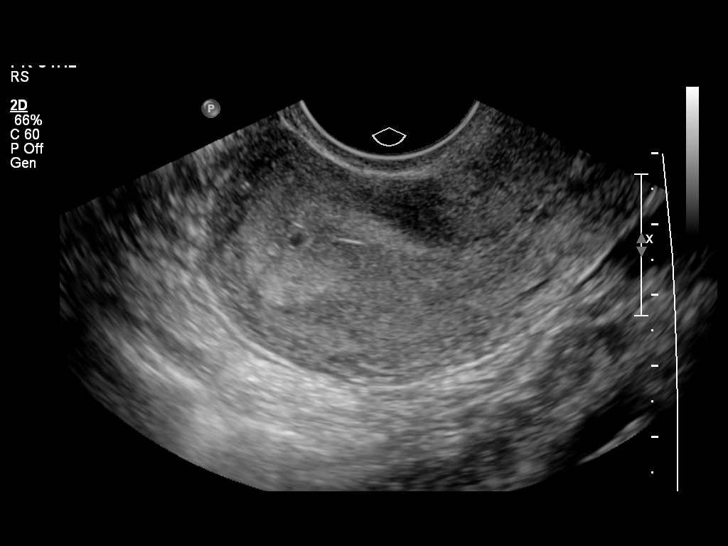

[14 of 28 positions shown; findings below may reference images not displayed]

Intrauterine gestational sac:  Tiny focal fluid collection within
endometrial canal, rounded, question tiny early intrauterine
gestational sac.
Yolk sac: Not identified
Embryo: Not identified
Cardiac Activity: N/A
Heart Rate: N/A bpm

MSD: 2.3  mm     4 w    5 d
US EDC: 01/18/2012

Maternal uterus/adnexae:
No subchorionic hemorrhage.
Right ovary normal size morphology, 1.8 x 1.9 x 3.9 cm.
Left ovary measures 4.7 x 2.7 x 3.2 cm and contains a small corpus
luteal cyst.
No adnexal masses.
Trace free pelvic fluid.
IMPRESSION: Probable tiny early intrauterine gestational sac, corresponding to
gestational age of 4 weeks 5 days.
No yolk sac or fetal pole are identified.
Can establish fetal viability by follow-up ultrasound in 14 days.

## 2012-07-05 IMAGING — US US OB TRANSVAGINAL
1 series · 14 of 28 positions shown · non-contrast
Comparison: 

CLINICAL DATA: Fall viability.  Pelvic pain and.  This may
gestational age by last menstrual period equals 8 weeks 3 days

TRANSVAGINAL OB ULTRASOUND
TECHNIQUE: Transvaginal ultrasound was performed for evaluation of
the gestation as well as the maternal uterus and adnexal regions.

[Series 1: us ob transvaginal · 32 acquisitions, 14 frames shown]
[im 2/32]
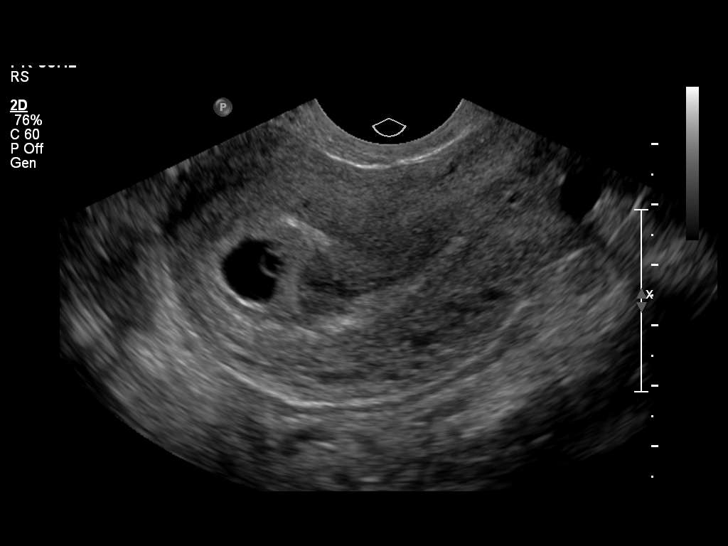
[im 4/32]
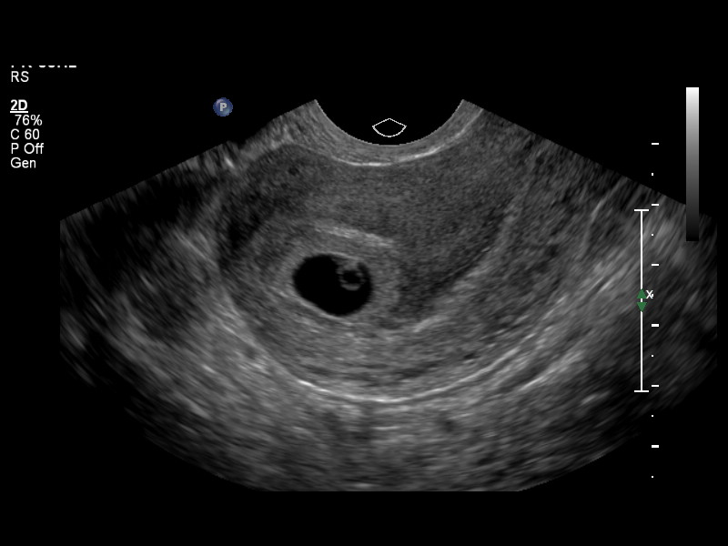
[im 6/32]
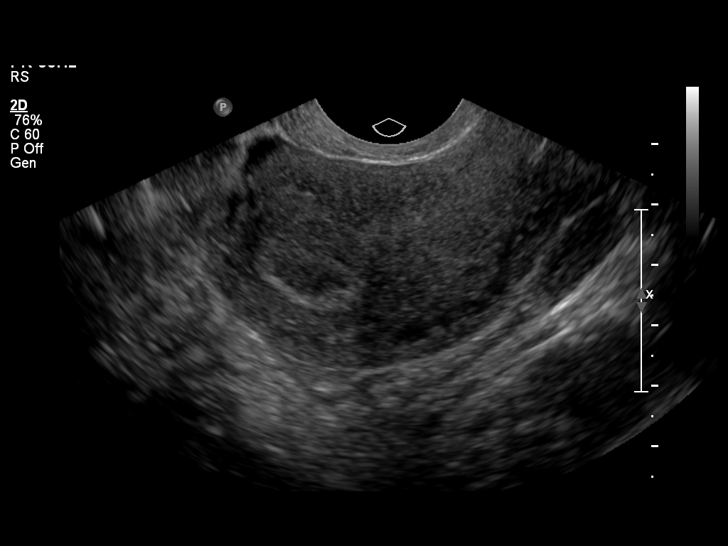
[im 9/32]
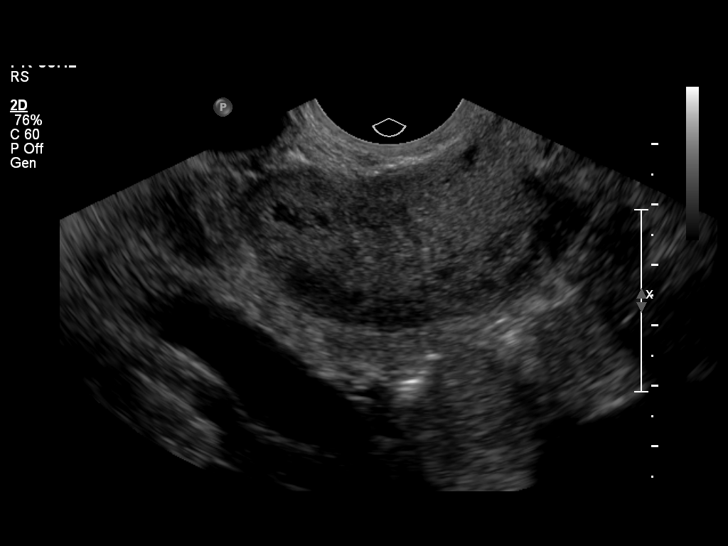
[im 11/32]
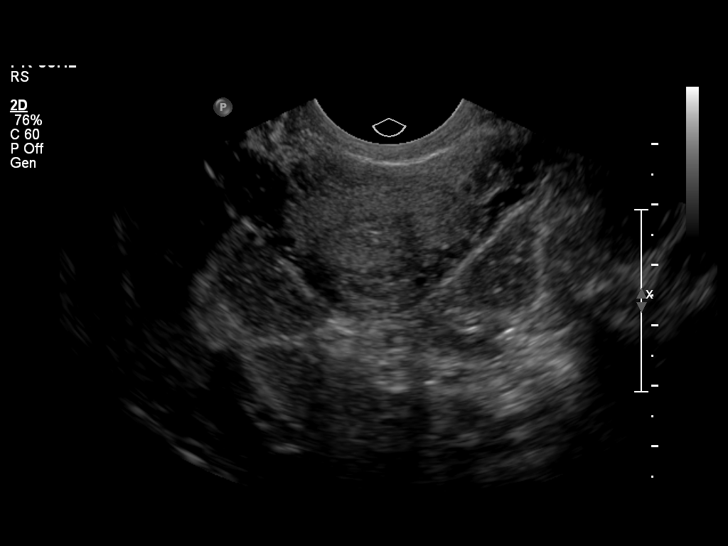
[im 13/32]
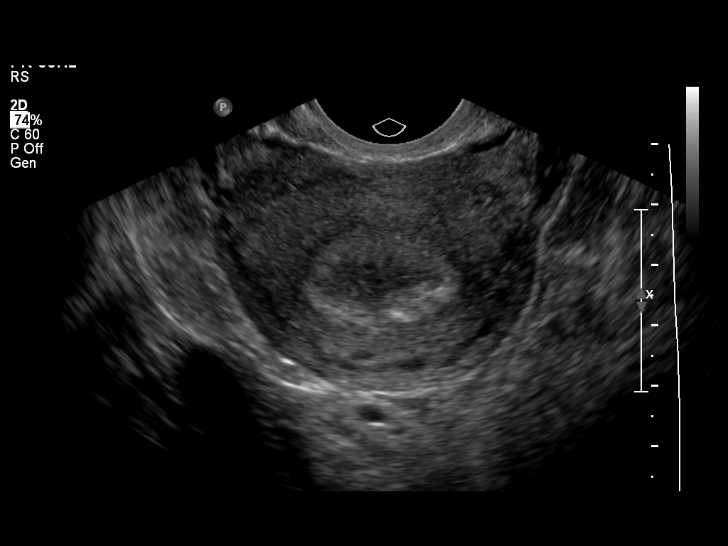
[im 15/32]
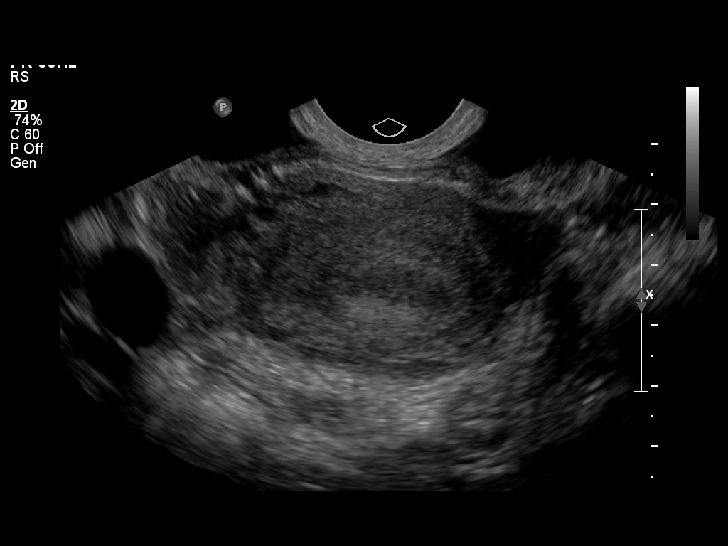
[im 18/32]
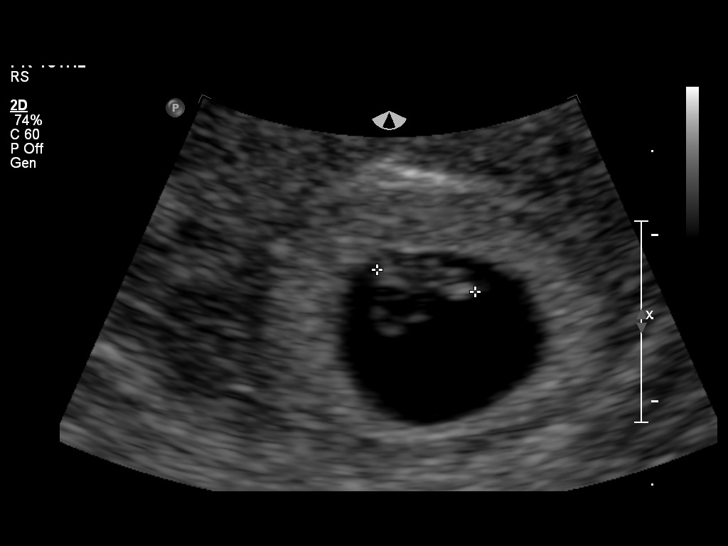
[im 20/32]
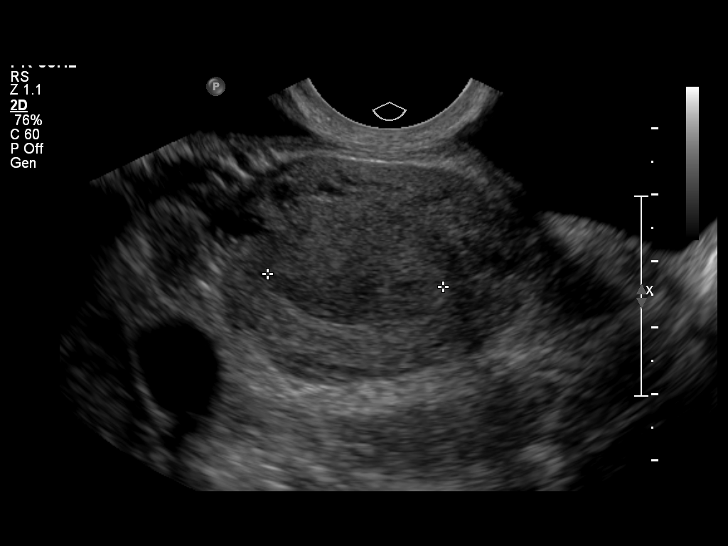
[im 22/32]
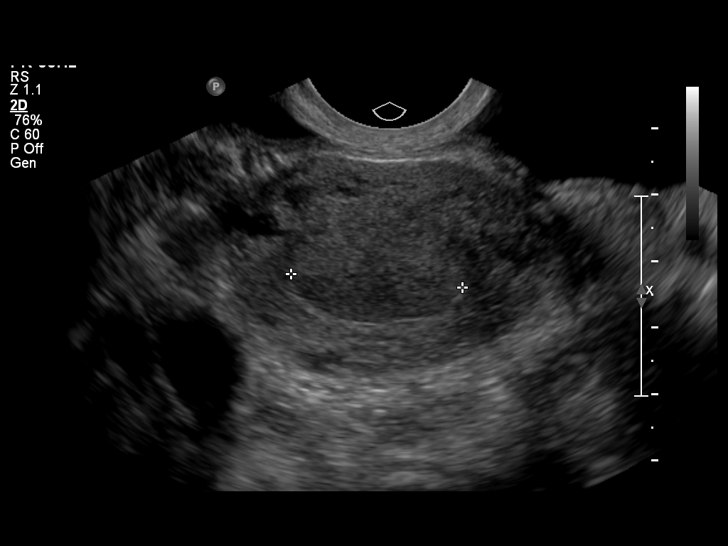
[im 25/32]
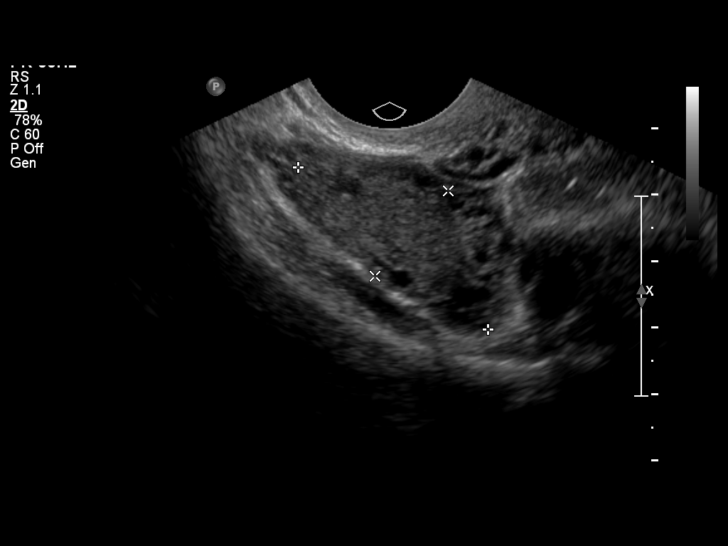
[im 27/32]
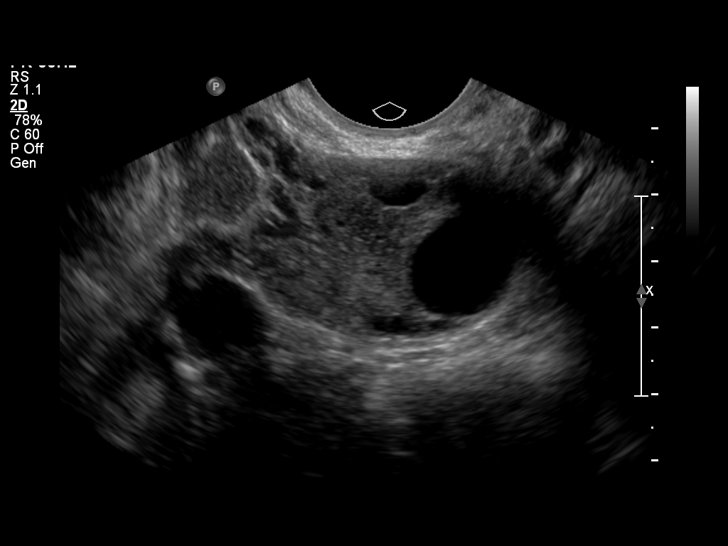
[im 29/32]
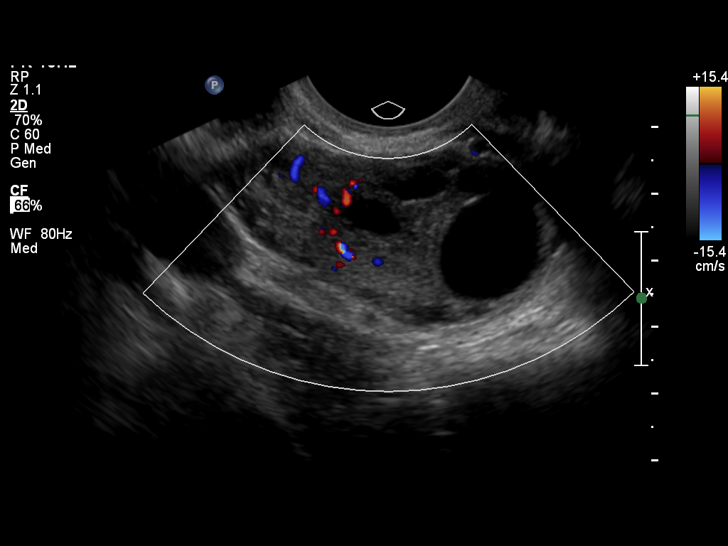
[im 32/32]
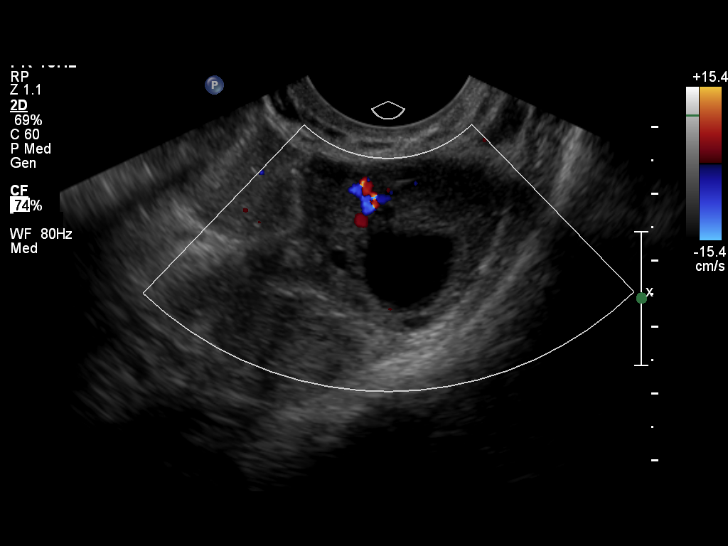

[14 of 28 positions shown; findings below may reference images not displayed]

FINDINGS: Single intrauterine the five gestational sac with yolk
sac and embryo.  There is cardiac activity with heart rate equal to
115 beats per minute.

Crown-rump length equals 6.1 mm for estimated gestational age of 6
weeks 3 days.

There is a moderate subchorionic hemorrhage.  The ovaries are
normal.  No free fluid.
IMPRESSION: 1.  Single uterine gestational sac with embryo and normal cardiac
activity.
2. Estimated gestational age by crown-rump length = 6 weeks 3 days.
3. Moderate to large subchorionic hemorrhage.

## 2014-07-29 ENCOUNTER — Encounter (HOSPITAL_COMMUNITY): Payer: Self-pay | Admitting: *Deleted

## 2016-09-21 NOTE — Addendum Note (Signed)
Encounter addended by: Dorathy KinsmanVirginia Tricia Oaxaca, CNM on: 09/21/2016  2:24 AM<BR>    Actions taken: Diagnosis association updated
